# Patient Record
Sex: Female | Born: 1970 | Race: White | Hispanic: No | Marital: Married | Smoking: Former smoker
Health system: Southern US, Community
[De-identification: ages and names within clinical notes are randomized; demographics above are authoritative.]

## PROBLEM LIST (undated history)

## (undated) DIAGNOSIS — F329 Major depressive disorder, single episode, unspecified: Secondary | ICD-10-CM

## (undated) DIAGNOSIS — R1909 Other intra-abdominal and pelvic swelling, mass and lump: Secondary | ICD-10-CM

## (undated) DIAGNOSIS — M7710 Lateral epicondylitis, unspecified elbow: Secondary | ICD-10-CM

## (undated) DIAGNOSIS — R002 Palpitations: Secondary | ICD-10-CM

## (undated) DIAGNOSIS — M1990 Unspecified osteoarthritis, unspecified site: Secondary | ICD-10-CM

## (undated) DIAGNOSIS — F32A Depression, unspecified: Secondary | ICD-10-CM

## (undated) DIAGNOSIS — E785 Hyperlipidemia, unspecified: Secondary | ICD-10-CM

## (undated) DIAGNOSIS — I341 Nonrheumatic mitral (valve) prolapse: Secondary | ICD-10-CM

## (undated) DIAGNOSIS — J329 Chronic sinusitis, unspecified: Secondary | ICD-10-CM

## (undated) DIAGNOSIS — K648 Other hemorrhoids: Secondary | ICD-10-CM

## (undated) DIAGNOSIS — R079 Chest pain, unspecified: Secondary | ICD-10-CM

## (undated) DIAGNOSIS — F419 Anxiety disorder, unspecified: Secondary | ICD-10-CM

## (undated) DIAGNOSIS — E669 Obesity, unspecified: Secondary | ICD-10-CM

## (undated) DIAGNOSIS — I10 Essential (primary) hypertension: Secondary | ICD-10-CM

## (undated) DIAGNOSIS — M109 Gout, unspecified: Secondary | ICD-10-CM

## (undated) DIAGNOSIS — K589 Irritable bowel syndrome without diarrhea: Secondary | ICD-10-CM

## (undated) DIAGNOSIS — J189 Pneumonia, unspecified organism: Secondary | ICD-10-CM

## (undated) DIAGNOSIS — K760 Fatty (change of) liver, not elsewhere classified: Secondary | ICD-10-CM

## (undated) HISTORY — DX: Chronic sinusitis, unspecified: J32.9

## (undated) HISTORY — DX: Hyperlipidemia, unspecified: E78.5

## (undated) HISTORY — DX: Nonrheumatic mitral (valve) prolapse: I34.1

## (undated) HISTORY — DX: Lateral epicondylitis, unspecified elbow: M77.10

## (undated) HISTORY — DX: Depression, unspecified: F32.A

## (undated) HISTORY — DX: Other intra-abdominal and pelvic swelling, mass and lump: R19.09

## (undated) HISTORY — DX: Anxiety disorder, unspecified: F41.9

## (undated) HISTORY — DX: Other hemorrhoids: K64.8

## (undated) HISTORY — DX: Pneumonia, unspecified organism: J18.9

## (undated) HISTORY — PX: ENDOMETRIAL ABLATION: SHX621

## (undated) HISTORY — DX: Unspecified osteoarthritis, unspecified site: M19.90

## (undated) HISTORY — DX: Chest pain, unspecified: R07.9

## (undated) HISTORY — DX: Fatty (change of) liver, not elsewhere classified: K76.0

## (undated) HISTORY — DX: Gout, unspecified: M10.9

## (undated) HISTORY — DX: Palpitations: R00.2

## (undated) HISTORY — PX: TUBAL LIGATION: SHX77

## (undated) HISTORY — DX: Essential (primary) hypertension: I10

## (undated) HISTORY — DX: Irritable bowel syndrome, unspecified: K58.9

## (undated) HISTORY — DX: Obesity, unspecified: E66.9

## (undated) HISTORY — PX: SPINE SURGERY: SHX786

## (undated) HISTORY — DX: Major depressive disorder, single episode, unspecified: F32.9

---

## 1997-10-26 ENCOUNTER — Ambulatory Visit (HOSPITAL_COMMUNITY): Admission: RE | Admit: 1997-10-26 | Discharge: 1997-10-26 | Payer: Self-pay | Admitting: *Deleted

## 1999-04-15 ENCOUNTER — Other Ambulatory Visit: Admission: RE | Admit: 1999-04-15 | Discharge: 1999-04-15 | Payer: Self-pay | Admitting: Obstetrics and Gynecology

## 1999-06-26 ENCOUNTER — Inpatient Hospital Stay (HOSPITAL_COMMUNITY): Admission: AD | Admit: 1999-06-26 | Discharge: 1999-06-26 | Payer: Self-pay | Admitting: Obstetrics and Gynecology

## 1999-10-09 ENCOUNTER — Inpatient Hospital Stay (HOSPITAL_COMMUNITY): Admission: AD | Admit: 1999-10-09 | Discharge: 1999-10-09 | Payer: Self-pay | Admitting: Obstetrics and Gynecology

## 1999-10-11 ENCOUNTER — Inpatient Hospital Stay (HOSPITAL_COMMUNITY): Admission: AD | Admit: 1999-10-11 | Discharge: 1999-10-11 | Payer: Self-pay | Admitting: *Deleted

## 1999-11-07 ENCOUNTER — Inpatient Hospital Stay (HOSPITAL_COMMUNITY): Admission: AD | Admit: 1999-11-07 | Discharge: 1999-11-10 | Payer: Self-pay | Admitting: Obstetrics and Gynecology

## 1999-11-11 ENCOUNTER — Inpatient Hospital Stay (HOSPITAL_COMMUNITY): Admission: AD | Admit: 1999-11-11 | Discharge: 1999-11-11 | Payer: Self-pay | Admitting: Obstetrics and Gynecology

## 1999-11-17 ENCOUNTER — Inpatient Hospital Stay (HOSPITAL_COMMUNITY): Admission: AD | Admit: 1999-11-17 | Discharge: 1999-11-17 | Payer: Self-pay | Admitting: Physical Therapy

## 1999-12-20 ENCOUNTER — Other Ambulatory Visit: Admission: RE | Admit: 1999-12-20 | Discharge: 1999-12-20 | Payer: Self-pay | Admitting: Obstetrics and Gynecology

## 2001-01-01 ENCOUNTER — Other Ambulatory Visit: Admission: RE | Admit: 2001-01-01 | Discharge: 2001-01-01 | Payer: Self-pay | Admitting: Obstetrics and Gynecology

## 2002-01-22 ENCOUNTER — Other Ambulatory Visit: Admission: RE | Admit: 2002-01-22 | Discharge: 2002-01-22 | Payer: Self-pay | Admitting: Obstetrics and Gynecology

## 2003-04-01 ENCOUNTER — Other Ambulatory Visit: Admission: RE | Admit: 2003-04-01 | Discharge: 2003-04-01 | Payer: Self-pay | Admitting: Obstetrics and Gynecology

## 2004-05-03 ENCOUNTER — Other Ambulatory Visit: Admission: RE | Admit: 2004-05-03 | Discharge: 2004-05-03 | Payer: Self-pay | Admitting: Obstetrics and Gynecology

## 2005-01-29 ENCOUNTER — Encounter: Admission: RE | Admit: 2005-01-29 | Discharge: 2005-01-29 | Payer: Self-pay | Admitting: Family Medicine

## 2006-12-14 ENCOUNTER — Other Ambulatory Visit: Payer: Self-pay | Admitting: Obstetrics and Gynecology

## 2006-12-15 ENCOUNTER — Emergency Department (HOSPITAL_COMMUNITY): Admission: EM | Admit: 2006-12-15 | Discharge: 2006-12-15 | Payer: Self-pay | Admitting: Emergency Medicine

## 2006-12-15 ENCOUNTER — Ambulatory Visit: Payer: Self-pay | Admitting: Cardiovascular Disease

## 2008-09-28 ENCOUNTER — Encounter
Admission: RE | Admit: 2008-09-28 | Discharge: 2008-09-28 | Payer: Self-pay | Admitting: Physical Medicine and Rehabilitation

## 2009-01-16 HISTORY — PX: CERVICAL FUSION: SHX112

## 2009-06-04 ENCOUNTER — Ambulatory Visit (HOSPITAL_COMMUNITY): Admission: RE | Admit: 2009-06-04 | Discharge: 2009-06-04 | Payer: Self-pay | Admitting: Orthopedic Surgery

## 2009-06-16 HISTORY — PX: LUMBAR DISC SURGERY: SHX700

## 2009-08-10 ENCOUNTER — Encounter: Admission: RE | Admit: 2009-08-10 | Discharge: 2009-08-10 | Payer: Self-pay | Admitting: Neurosurgery

## 2009-09-03 ENCOUNTER — Encounter: Admission: RE | Admit: 2009-09-03 | Discharge: 2009-09-03 | Payer: Self-pay | Admitting: Neurosurgery

## 2009-09-13 ENCOUNTER — Encounter: Payer: Self-pay | Admitting: Internal Medicine

## 2010-02-15 NOTE — Letter (Signed)
Summary: Colonoscopy Letter  [facility]  [address] [place], [place] [address]   Phone: [phone]  Fax: [phone]      September 13, 2009 MRN: [id number]   Jasmine Nolan [address] [place], [place]  [address]   Dear [name],   According to your medical record, it is time for you to schedule a Colonoscopy. The American Cancer Society recommends this procedure as a method to detect early colon cancer. Patients with a family history of colon cancer, or a personal history of colon polyps or inflammatory bowel disease are at increased risk.  This letter has been generated based on the recommendations made at the time of your procedure. If you feel that in your particular situation this may no longer apply, please contact our office.  Please call our office at [phone] to schedule this appointment or to update your records at your earliest convenience.  Thank you for cooperating with [place] to provide you with the very best care possible.   Sincerely,  Wilhemina Bonito. Marina Goodell, M.D.  [facility] [phone]

## 2010-04-04 LAB — CBC
RBC: 4.75 MIL/uL (ref 3.87–5.11)
RDW: 15.9 % — ABNORMAL HIGH (ref 11.5–15.5)

## 2010-06-03 NOTE — H&P (Signed)
[facility] [place]  Patient:    Jasmine Nolan, Jasmine Nolan                          MRN: [id number] Adm. Date:  [id number] Attending:  Trevor Iha                         History and Physical  HISTORY OF PRESENT ILLNESS:   [name] is a 40 year old G1, P0, at 39+ weeks who presents for primary cesarean section.  The pregnancy has been complicated by severe hemorrhoids, currently on narcotics for pain relief and has tried multiple regimens for relief.  Because of severe hemorrhoids, unfavorable cervix, and the patient refuses a trial of labor because she does not want to worsen hemorrhoids, the patient presents for primary low segment transverse cesarean section.  Estimated date of confinement is November 10, 1999.  PAST MEDICAL HISTORY:         Mitral valve prolapse requiring prophylaxis.  PAST SURGICAL HISTORY:        Endoscopy.  Colonoscopy.  Laparoscopy.  MEDICATIONS:                  Vicodin as needed.  Tylenol as needed.  ALLERGIES:                    BENTYL, AMOXICILLIN, SULFA.  Amoxicillin and sulfa cause stomach cramps.  PHYSICAL EXAMINATION:  VITAL SIGNS:                  Blood pressure 117/68.  HEART:                        Regular rate and rhythm.  LUNGS:                        Clear to auscultation bilaterally.  ABDOMEN:                      Gravid, nontender.  PELVIC:                       Cervix is closed, thigh, and high.  Several large hemorrhoids are noted, markedly inflamed.  IMPRESSION:                   Intrauterine pregnancy at 39 weeks and severe hemorrhoids.  The patient desires primary low transverse section and refuses attempt at trial of labor because of the severity of the hemorrhoids.  The risks and benefits were discussed at length.  Informed consent was obtained.  PLAN:                         Primary low segment transverse cesarean section. DD:  11/07/99 TD:  11/07/99 Job: [id number]

## 2010-06-03 NOTE — Op Note (Signed)
[facility] [place]  Patient:    Jasmine Nolan, Jasmine Nolan                          MRN: [id number] Proc. Date: 11/07/99 Adm. Date:  [id number] Attending:  Trevor Iha                           Operative Report  PREOPERATIVE DIAGNOSIS:       Intrauterine pregnancy at 39 weeks, unfavorable                               cervix, and severe hemorrhoids.  Patient                               requesting primary cesarean section.  POSTOPERATIVE DIAGNOSIS:      Intrauterine pregnancy at 39 weeks, unfavorable                               cervix, and severe hemorrhoids.  Patient                               requesting primary cesarean section.  OPERATION:                    Primary low segment transverse cesarean section.  SURGEON:                      Trevor Iha, M.D.  ANESTHESIA:                   Spinal.  ESTIMATED BLOOD LOSS:         800 cc.  INDICATIONS:                  The patient is a 40 year old white female, gravida 1, para 0, at 39 weeks with severe hemorrhoids.  Currently on narcotics for pain relief, and tried multiple courses of hydrocortisone suppositories without success.  Desires primary low segment transverse cesarean section because of the severity of the hemorrhoids.  The risks and benefits were discussed and informed consent was obtained.  DESCRIPTION OF PROCEDURE:     After adequate analgesia, the patient was placed in the supine position with a left lateral tilt.  She was sterilely prepped and draped.  A Pfannenstiel skin incision was made two fingerbreadths above the pubic symphysis.  It was taken down sharply and the fascia was incised transversely, and extended superiorly and inferiorly up to the bellies of the rectus muscle which were separated sharply in the midline.  The peritoneum was entered sharply.  A bladder blade was placed and the underlying serosa was elevated, nicked, and incised transversely.  A bladder flap was created  and placed behind the bladder blade.  A ________ incision was made down to the infants vertex, and extended laterally with the operators fingertips.  The infants vertex was delivered with a vacuum extractor with one easy pull.  The nares and pharynx were suctioned.  A nuchal cord x 1 was reduced.  The infant was then delivered atraumatically.  The cord was clamped and the infant was handed to the pediatrician.  Cord blood was obtained.  The placenta extracted manually.  It was exteriorized and wiped clean with the dry lap, and ______ incision was closed in two layers with the first being a running locking layer, and the second being an imbricating layer, and 0 Monocryl.  The uterus was placed back into the peritoneal cavity, and after a copious amount of irrigation, and adequate hemostasis was assured, the peritoneum was closed with 0 Monocryl.  The rectus muscle was plicated in the midline.  Irrigation was again applied and after adequate hemostasis, the fascia was closed with 0 Panacryl in a running fashion.  Irrigation was again applied, and after adequate hemostasis, the skin was stapled, and Steri-Strips applied.                                The patient tolerated the procedure well and was stable on transfer to the recovery room.  The sponge and instrument count was normal x 3.  The patient received 1 g of cefotetan preoperatively for her mitral valve prolapse. DD:  11/07/99 TD:  11/07/99 Job: [id number]

## 2010-06-06 ENCOUNTER — Ambulatory Visit (INDEPENDENT_AMBULATORY_CARE_PROVIDER_SITE_OTHER): Payer: BC Managed Care – PPO | Admitting: Family Medicine

## 2010-06-06 ENCOUNTER — Encounter: Payer: Self-pay | Admitting: Family Medicine

## 2010-06-06 VITALS — BP 120/70 | HR 64 | Temp 98.4°F | Ht 64.0 in | Wt 176.1 lb

## 2010-06-06 DIAGNOSIS — M129 Arthropathy, unspecified: Secondary | ICD-10-CM

## 2010-06-06 DIAGNOSIS — M2550 Pain in unspecified joint: Secondary | ICD-10-CM | POA: Insufficient documentation

## 2010-06-06 DIAGNOSIS — F32A Depression, unspecified: Secondary | ICD-10-CM

## 2010-06-06 DIAGNOSIS — M1990 Unspecified osteoarthritis, unspecified site: Secondary | ICD-10-CM

## 2010-06-06 DIAGNOSIS — E669 Obesity, unspecified: Secondary | ICD-10-CM

## 2010-06-06 DIAGNOSIS — F329 Major depressive disorder, single episode, unspecified: Secondary | ICD-10-CM

## 2010-06-06 NOTE — Assessment & Plan Note (Signed)
New.  Likely due to rapid weight gain but given hand involvement and swelling/erythema, will rule out RA. Orders Placed This Encounter  Procedures  . C-reactive protein  . Sed Rate (ESR)  . Rheumatoid Factor

## 2010-06-06 NOTE — Assessment & Plan Note (Signed)
Stable. Continue current dose of Zoloft.

## 2010-06-06 NOTE — Assessment & Plan Note (Signed)
Deteriorated. Advised keeping food/calorie journal. Pt to bring to next OV.

## 2010-06-06 NOTE — Progress Notes (Signed)
40 yo here to establish care.  1. Arthritis-multiple joints.  Started a few months ago. Bilateral knees, elbows and 2nd PIP joints. Intermittent in nature.  Not worse during particular time of day. Has a desk job.  Only thing that has changed in past several months is that she has gained 20 pounds since her neck surgery (less active). Sometimes finger joints look swollen and red.  First cousin has RA, no other family h/o autoimmune or joint problems (other than OA). No rashes.  2.  Depression- has been on Zoloft since her dad died of colon cancer 10 years ago. Feels she is doing well, except during anniversaries and holidays. Good support system in her family.  Divorced two years ago but she feels she and her daughter are handling it well.  3.  Weight gain- wants to loose weight.  Knows she needs to be more active and start watching her calories. Denies any symptoms of hypo or hyper thyroidism.    The PMH, PSH, Social History, Family History, Medications, and allergies have been reviewed in [facility], and have been updated if relevant.  ROS: Patient reports no  vision/ hearing changes,anorexia, weight change, fever ,adenopathy, persistant / recurrent hoarseness, swallowing issues, chest pain, edema,persistant / recurrent cough, hemoptysis, dyspnea(rest, exertional, paroxysmal nocturnal), gastrointestinal  bleeding (melena, rectal bleeding), abdominal pain, excessive heart burn, GU symptoms(dysuria, hematuria, pyuria, voiding/incontinence  Issues) syncope, focal weakness, severe memory loss, concerning skin lesions, depression, anxiety, abnormal bruising/bleeding,  breast masses or abnormal vaginal bleeding.    Physical exam: BP 120/70  Pulse 64  Temp(Src) 98.4 F (36.9 C) (Oral)  Ht 5\' 4"  (1.626 m)  Wt 176 lb 1.9 oz (79.888 kg)  BMI 30.23 kg/m2  General:  overweight,well-nourished,in no acute distress; alert,appropriate and cooperative throughout examination Head:  normocephalic and  atraumatic.   Eyes:  vision grossly intact, pupils equal, pupils round, and pupils reactive to light.   Ears:  R ear normal and L ear normal.   Nose:  no external deformity.   Mouth:  good dentition.   Neck:  No deformities, masses, or tenderness noted.  Lungs:  Normal respiratory effort, chest expands symmetrically. Lungs are clear to auscultation, no crackles or wheezes. Heart:  Normal rate and regular rhythm. S1 and S2 normal without gallop, murmur, click, rub or other extra sounds. Abdomen:  Bowel sounds positive,abdomen soft and non-tender without masses, organomegaly or hernias noted. Msk:  No deformity or scoliosis noted of thoracic or lumbar spine.   Extremities:  No clubbing, cyanosis, edema, or deformity noted with normal full range of motion of all joints.   Neurologic:  alert & oriented X3 and gait normal.   Skin:  Intact without suspicious lesions or rashes Cervical Nodes:  No lymphadenopathy noted Axillary Nodes:  No palpable lymphadenopathy Psych:  Cognition and judgment appear intact. Alert and cooperative with normal attention span and concentration. No apparent delusions, illusions, hallucinations

## 2010-06-07 LAB — RHEUMATOID FACTOR: Rhuematoid fact SerPl-aCnc: <10 IU/mL (ref <=14)

## 2010-09-29 ENCOUNTER — Ambulatory Visit (INDEPENDENT_AMBULATORY_CARE_PROVIDER_SITE_OTHER): Payer: BC Managed Care – PPO | Admitting: Family Medicine

## 2010-09-29 ENCOUNTER — Encounter: Payer: Self-pay | Admitting: Family Medicine

## 2010-09-29 VITALS — BP 118/62 | HR 86 | Temp 98.5°F | Wt 176.2 lb

## 2010-09-29 DIAGNOSIS — F329 Major depressive disorder, single episode, unspecified: Secondary | ICD-10-CM

## 2010-09-29 DIAGNOSIS — F32A Depression, unspecified: Secondary | ICD-10-CM

## 2010-09-29 MED ORDER — BUSPIRONE HCL 15 MG PO TABS: 15.0000 mg | ORAL_TABLET | Freq: Two times a day (BID) | ORAL | Status: DC

## 2010-09-29 NOTE — Progress Notes (Signed)
  Subjective:    Patient ID: Jasmine Nolan, female    DOB: [birth year], 40 y.o.   MRN: [id number]  HPI  40 yo here for follow up anxiety/depression.  Has been on Zoloft 100 mg twice daily for over 10 years since her dad died. Dad died while she was pregnant with her daughter and then she had severe post partum depression. Has been doing well on this dose until recently.  Boyfriend was in a very serious accident and she has been caring for him. Work a little more stressful as well. Finds herself feeling very anxious all the time. No panic attacks, no Si or HI. Sometimes has a hard time staying asleep. No trouble falling asleep. Does not "feel depressed."  Patient Active Problem List  Diagnoses  . Joint pain  . Arthritis  . Depression  . Obesity   Past Medical History  Diagnosis Date  . Depression   . MVP (mitral valve prolapse)   . Arthritis    Past Surgical History  Procedure Date  . Cesarean section 2001  . Lumbar disc surgery June 2011  . Tubal ligation   . Endometrial ablation    History  Substance Use Topics  . Smoking status: Current Some Day Smoker  . Smokeless tobacco: Not on file  . Alcohol Use: Not on file   Family History  Problem Relation Age of Onset  . Cancer Father 64    died of colon CA  . Depression Mother   . Arthritis Mother    Allergies  Allergen Reactions  . Bactrim Nausea And Vomiting  . Bentyl Rash  . Tetanus Toxoids Other (See Comments)    fever   Current Outpatient Prescriptions on File Prior to Visit  Medication Sig Dispense Refill  . sertraline (ZOLOFT) 100 MG tablet Take 100 mg by mouth 2 (two) times daily.         The PMH, PSH, Social History, Family History, Medications, and allergies have been reviewed in [facility], and have been updated if relevant.   Review of Systems See HPI    Objective:   Physical Exam BP 118/62  Pulse 86  Temp(Src) 98.5 F (36.9 C) (Oral)  Wt 176 lb 4 oz (79.946 kg) Gen:  Alert, pleasant, good eye  contact Psych:  A little tearful but not anxious or depressed appearing.     Assessment & Plan:   1. Depression  busPIRone (BUSPAR) 15 MG tablet  Deteriorated. >25 min spent with face to face with patient, >50% counseling and/or coordinating care Will add Buspar 15 mg twice daily to Zoloft. See pt instructions for details.

## 2010-09-29 NOTE — Patient Instructions (Signed)
Good to see you, Lawson Fiscal. For the first few days that you are taking buspar, cut the tablet in half so you are taking 7.5 mg twice daily.  Hang in there. Call in me a few weeks to let me know how you're doing.    What is this medicine? BUSPIRONE (byoo SPYE rone) is used to treat anxiety disorders. This medicine may be used for other purposes; ask your health care provider or pharmacist if you have questions.   What should I tell my health care provider before I take this medicine? They need to know if you have any of these conditions: -kidney or liver disease -an unusual or allergic reaction to buspirone, other medicines, foods, dyes, or preservatives -pregnant or trying to get pregnant -breast-feeding   How should I use this medicine? Take this medicine by mouth with a glass of water. Follow the directions on the prescription label. You may take this medicine with or without food. To ensure that this medicine always works the same way for you, you should take it either always with or always without food. Take your doses at regular intervals. Do not take your medicine more often than directed. Do not stop taking except on the advice of your doctor or health care professional.   Talk to your pediatrician regarding the use of this medicine in children. Special care may be needed.   Overdosage: If you think you have taken too much of this medicine contact a poison control center or emergency room at once. NOTE: This medicine is only for you. Do not share this medicine with others.   What if I miss a dose? If you miss a dose, take it as soon as you can. If it is almost time for your next dose, take only that dose. Do not take double or extra doses.   What may interact with this medicine? Do not take this medicine with any of the following medications: -linezolid -MAOIs like Carbex, Eldepryl, Marplan, Nardil, and Parnate -methylene blue -procarbazine This medicine may also interact with  the following medications: -diazepam -digoxin -diltiazem -erythromycin -grapefruit juice -haloperidol -medicines for mental depression or mood problems -medicines for seizures like carbamazepine, phenobarbital and phenytoin -nefazodone -other medications for anxiety -rifampin -ritonavir -some antifungal medicines like itraconazole, ketoconazole, and voriconazole -verapamil -warfarin This list may not describe all possible interactions. Give your health care provider a list of all the medicines, herbs, non-prescription drugs, or dietary supplements you use. Also tell them if you smoke, drink alcohol, or use illegal drugs. Some items may interact with your medicine.   What should I watch for while using this medicine? Visit your doctor or health care professional for regular checks on your progress. It may take 1 to 2 weeks before your anxiety gets better.   You may get drowsy or dizzy. Do not drive, use machinery, or do anything that needs mental alertness until you know how this drug affects you. Do not stand or sit up quickly, especially if you are an older patient. This reduces the risk of dizzy or fainting spells. Alcohol can make you more drowsy and dizzy. Avoid alcoholic drinks.   What side effects may I notice from receiving this medicine? Side effects that you should report to your doctor or health care professional as soon as possible: -blurred vision or other vision changes -chest pain -confusion -difficulty breathing -feelings of hostility or anger -muscle aches and pains -numbness or tingling in hands or feet -ringing in the ears -skin  rash and itching -vomiting -weakness   Side effects that usually do not require medical attention (report to your doctor or health care professional if they continue or are bothersome): -disturbed dreams, nightmares -headache -nausea -restlessness or nervousness -sore throat and nasal congestion -stomach upset   This list may not  describe all possible side effects. Call your doctor for medical advice about side effects. You may report side effects to FDA at 1-800-FDA-1088.   Where should I keep my medicine? Keep out of the reach of children.   Store at room temperature below 30 degrees C (86 degrees F). Protect from light. Keep container tightly closed. Throw away any unused medicine after the expiration date.   NOTE:This sheet is a summary. It may not cover all possible information. If you have questions about this medicine, talk to your doctor, pharmacist, or health care provider.      2011, Elsevier/Gold Standard.

## 2010-10-13 ENCOUNTER — Encounter: Payer: Self-pay | Admitting: Family Medicine

## 2010-10-13 ENCOUNTER — Ambulatory Visit (INDEPENDENT_AMBULATORY_CARE_PROVIDER_SITE_OTHER): Payer: BC Managed Care – PPO | Admitting: Family Medicine

## 2010-10-13 VITALS — BP 102/74 | HR 67 | Temp 98.1°F | Wt 176.5 lb

## 2010-10-13 DIAGNOSIS — Z202 Contact with and (suspected) exposure to infections with a predominantly sexual mode of transmission: Secondary | ICD-10-CM

## 2010-10-13 DIAGNOSIS — J329 Chronic sinusitis, unspecified: Secondary | ICD-10-CM

## 2010-10-13 MED ORDER — AMOXICILLIN-POT CLAVULANATE 875-125 MG PO TABS: 1.0000 | ORAL_TABLET | Freq: Two times a day (BID) | ORAL | Status: AC

## 2010-10-13 NOTE — Progress Notes (Signed)
SUBJECTIVE:  Jasmine Nolan is a 40 y.o. female who complains of congestion, bilateral sinus pain and fever for 12 days. She denies a history of anorexia, chest pain, chills and dizziness and denies a history of asthma. Patient does smoke cigarettes.  Also found out that her boyfriend has HSV. Would like to be tested.  She has not yet developed any lesions.    Patient Active Problem List  Diagnoses  . Joint pain  . Arthritis  . Depression  . Obesity  . Exposure to STD  . Sinusitis   Past Medical History  Diagnosis Date  . Depression   . MVP (mitral valve prolapse)   . Arthritis    Past Surgical History  Procedure Date  . Cesarean section 2001  . Lumbar disc surgery June 2011  . Tubal ligation   . Endometrial ablation    History  Substance Use Topics  . Smoking status: Current Some Day Smoker  . Smokeless tobacco: Not on file  . Alcohol Use: Not on file   Family History  Problem Relation Age of Onset  . Cancer Father 49    died of colon CA  . Depression Mother   . Arthritis Mother    Allergies  Allergen Reactions  . Bactrim Nausea And Vomiting  . Bentyl Rash  . Tetanus Toxoids Other (See Comments)    fever   Current Outpatient Prescriptions on File Prior to Visit  Medication Sig Dispense Refill  . busPIRone (BUSPAR) 15 MG tablet Take 1 tablet (15 mg total) by mouth 2 (two) times daily.  60 tablet  2  . sertraline (ZOLOFT) 100 MG tablet Take 100 mg by mouth 2 (two) times daily.            OBJECTIVE: BP 102/74  Pulse 67  Temp(Src) 98.1 F (36.7 C) (Oral)  Wt 176 lb 8 oz (80.06 kg)  She appears well, vital signs are as noted. Ears normal.  Throat and pharynx normal.  Neck supple. No adenopathy in the neck. Nose is congested. Sinuses diffusely tender. The chest is clear, without wheezes or rales.  ASSESSMENT:  sinusitis  PLAN: Symptomatic therapy suggested: push fluids, rest and return office visit prn if symptoms persist or worsen. Given duration and  progression of symptoms, will treat for bacterial sinusitis.  Call or return to clinic prn if these symptoms worsen or fail to improve as anticipated.

## 2010-10-13 NOTE — Patient Instructions (Signed)
Good to see you. Take antibiotic as directed.  Drink lots of fluids.  Treat sympotmatically with Mucinex, nasal saline irrigation, and Tylenol/Ibuprofen. Call if not improving as expected in 5-7 days.

## 2010-10-14 LAB — HSV 2 ANTIBODY, IGG: HSV 2 Glycoprotein G Ab, IgG: 0.14 IV

## 2010-10-14 LAB — RPR

## 2010-10-25 LAB — URINALYSIS, ROUTINE W REFLEX MICROSCOPIC
Bilirubin Urine: NEGATIVE
Glucose, UA: NEGATIVE
Nitrite: NEGATIVE
Protein, ur: NEGATIVE
pH: 6

## 2010-10-25 LAB — CBC
HCT: 43
Hemoglobin: 14.5
MCV: 88.6
Platelets: 220
Platelets: 211
RDW: 13.7
RDW: 13.5
WBC: 10.6 — ABNORMAL HIGH

## 2010-10-25 LAB — COMPREHENSIVE METABOLIC PANEL
AST: 24
Albumin: 3.5
CO2: 22
Creatinine, Ser: 0.79
GFR calc non Af Amer: >60
Potassium: 3.7
Sodium: 136
Total Bilirubin: 0.9

## 2010-10-25 LAB — DIFFERENTIAL
Basophils Absolute: 0
Lymphocytes Relative: 22
Lymphs Abs: 2.5
Monocytes Absolute: 0.6
Neutrophils Relative %: 71

## 2010-10-25 LAB — URINE MICROSCOPIC-ADD ON

## 2010-10-27 ENCOUNTER — Ambulatory Visit (INDEPENDENT_AMBULATORY_CARE_PROVIDER_SITE_OTHER): Payer: BC Managed Care – PPO | Admitting: Family Medicine

## 2010-10-27 ENCOUNTER — Encounter: Payer: Self-pay | Admitting: Family Medicine

## 2010-10-27 VITALS — BP 120/70 | HR 64 | Temp 98.5°F | Ht 64.0 in | Wt 174.8 lb

## 2010-10-27 DIAGNOSIS — M7710 Lateral epicondylitis, unspecified elbow: Secondary | ICD-10-CM | POA: Insufficient documentation

## 2010-10-27 MED ORDER — MELOXICAM 15 MG PO TABS: 15.0000 mg | ORAL_TABLET | Freq: Every day | ORAL | Status: DC

## 2010-10-27 NOTE — Progress Notes (Signed)
Subjective:    Jasmine Nolan is a 40 y.o. female who presents with left elbow pain. Onset of the symptoms was several weeks ago. Inciting event: increased use and carrying heavy bags. Current symptoms include: point tenderness around tendon insertion. Pain is aggravated by: grasping, supination/pronation as when opening doors. Symptoms have been intermittent. Patient has had no prior elbow problems but she has had cervical neck surgery.  No tingling into her fingers or UE weakness.   Patient Active Problem List  Diagnoses  . Joint pain  . Arthritis  . Depression  . Obesity  . Exposure to STD  . Sinusitis  . Lateral epicondylitis  of elbow   Past Medical History  Diagnosis Date  . Depression   . MVP (mitral valve prolapse)   . Arthritis    Past Surgical History  Procedure Date  . Cesarean section 2001  . Lumbar disc surgery June 2011  . Tubal ligation   . Endometrial ablation    History  Substance Use Topics  . Smoking status: Current Some Day Smoker  . Smokeless tobacco: Not on file  . Alcohol Use: Not on file   Family History  Problem Relation Age of Onset  . Cancer Father 52    died of colon CA  . Depression Mother   . Arthritis Mother    Allergies  Allergen Reactions  . Bactrim Nausea And Vomiting  . Bentyl Rash  . Tetanus Toxoids Other (See Comments)    fever   Current Outpatient Prescriptions on File Prior to Visit  Medication Sig Dispense Refill  . busPIRone (BUSPAR) 15 MG tablet Take 1 tablet (15 mg total) by mouth 2 (two) times daily.  60 tablet  2  . sertraline (ZOLOFT) 100 MG tablet Take 100 mg by mouth 2 (two) times daily.         The PMH, PSH, Social History, Family History, Medications, and allergies have been reviewed in [facility], and have been updated if relevant.   Review of Systems Pertinent items are noted in HPI.   Objective:    BP 120/70  Pulse 64  Temp(Src) 98.5 F (36.9 C) (Oral)  Ht 5\' 4"  (1.626 m)  Wt 174 lb 12 oz (79.266 kg)  BMI  30.00 kg/m2 Right elbow: Normal, FROM without tenderness  Left elbow:  full active ROM and tenderness over lateral epicondyle     Assessment:    left lateral epicondylitis    Plan:    Rest, ice, compression, and elevation (RICE) therapy. Meloxicam and stretches and instructions given from Sports Medicine Advisor.

## 2010-10-27 NOTE — Patient Instructions (Signed)
Good to see you. Please take Meloxicam daily for next 1 or 2. Follow stretches on handout.

## 2010-11-02 ENCOUNTER — Encounter: Payer: Self-pay | Admitting: Family Medicine

## 2010-11-02 ENCOUNTER — Ambulatory Visit (INDEPENDENT_AMBULATORY_CARE_PROVIDER_SITE_OTHER): Payer: BC Managed Care – PPO | Admitting: Family Medicine

## 2010-11-02 VITALS — BP 110/60 | HR 57 | Temp 98.5°F | Wt 175.0 lb

## 2010-11-02 DIAGNOSIS — W57XXXA Bitten or stung by nonvenomous insect and other nonvenomous arthropods, initial encounter: Secondary | ICD-10-CM | POA: Insufficient documentation

## 2010-11-02 DIAGNOSIS — T148XXA Other injury of unspecified body region, initial encounter: Secondary | ICD-10-CM

## 2010-11-02 DIAGNOSIS — IMO0001 Reserved for inherently not codable concepts without codable children: Secondary | ICD-10-CM

## 2010-11-02 MED ORDER — DOXYCYCLINE HYCLATE 100 MG PO CAPS: 100.0000 mg | ORAL_CAPSULE | Freq: Two times a day (BID) | ORAL | Status: AC

## 2010-11-02 NOTE — Patient Instructions (Signed)
Continue to keep area clean and covered. Start taking the Doxycyline and you may want to try taking benadryl 25 mg nightly for next few days. Keep me updated with your symptoms.

## 2010-11-02 NOTE — Progress Notes (Signed)
  Subjective:    Patient ID: Jasmine Nolan, female    DOB: 1970-10-26, 40 y.o.   MRN: [id number]  HPI  40 yo here for ?spider bite.  Did not actually see anything bite her but woke up on Sunday morning with what looked like a mosquito bite on her right forearm. Over next several days, became larger and blister busted. Now raised erythematous, itchy and painful area around it.  Has not been taking anything for it.  No fever, myalgias, chills.   Review of Systems See HPI  Patient Active Problem List  Diagnoses  . Joint pain  . Arthritis  . Depression  . Obesity  . Exposure to STD  . Sinusitis  . Lateral epicondylitis  of elbow   Past Medical History  Diagnosis Date  . Depression   . MVP (mitral valve prolapse)   . Arthritis    Past Surgical History  Procedure Date  . Cesarean section 2001  . Lumbar disc surgery June 2011  . Tubal ligation   . Endometrial ablation    History  Substance Use Topics  . Smoking status: Current Some Day Smoker  . Smokeless tobacco: Not on file  . Alcohol Use: Not on file   Family History  Problem Relation Age of Onset  . Cancer Father 35    died of colon CA  . Depression Mother   . Arthritis Mother    Allergies  Allergen Reactions  . Bactrim Nausea And Vomiting  . Bentyl Rash  . Tetanus Toxoids Other (See Comments)    fever   Current Outpatient Prescriptions on File Prior to Visit  Medication Sig Dispense Refill  . busPIRone (BUSPAR) 15 MG tablet Take 1 tablet (15 mg total) by mouth 2 (two) times daily.  60 tablet  2  . meloxicam (MOBIC) 15 MG tablet Take 1 tablet (15 mg total) by mouth daily.  30 tablet  2  . sertraline (ZOLOFT) 100 MG tablet Take 100 mg by mouth 2 (two) times daily.         The PMH, PSH, Social History, Family History, Medications, and allergies have been reviewed in [facility], and have been updated if relevant.     Objective:   Physical Exam BP 110/60  Pulse 57  Temp(Src) 98.5 F (36.9 C) (Oral)  Wt 175  lb (79.379 kg)  Gen:  Alert, pleasant, NAD Skin:  Two erythematous, small papular/pustular areas on right forearm. No surrounding erythema.     Assessment & Plan:   1. Bug bite with infection    New. No signs of necrosis. Will cover with doxycycline. See pt instructions for details.

## 2010-12-23 ENCOUNTER — Telehealth: Payer: Self-pay | Admitting: Internal Medicine

## 2010-12-23 NOTE — Telephone Encounter (Signed)
Made an appointment for Monday the 10th because of knot in groin area.

## 2010-12-26 ENCOUNTER — Ambulatory Visit (INDEPENDENT_AMBULATORY_CARE_PROVIDER_SITE_OTHER): Payer: BC Managed Care – PPO | Admitting: Family Medicine

## 2010-12-26 ENCOUNTER — Telehealth: Payer: Self-pay | Admitting: *Deleted

## 2010-12-26 ENCOUNTER — Encounter: Payer: Self-pay | Admitting: Family Medicine

## 2010-12-26 VITALS — BP 102/62 | HR 64 | Temp 98.4°F | Wt 173.5 lb

## 2010-12-26 DIAGNOSIS — R1909 Other intra-abdominal and pelvic swelling, mass and lump: Secondary | ICD-10-CM

## 2010-12-26 DIAGNOSIS — R1900 Intra-abdominal and pelvic swelling, mass and lump, unspecified site: Secondary | ICD-10-CM

## 2010-12-26 MED ORDER — OXYCODONE-ACETAMINOPHEN 5-325 MG PO TABS: 1.0000 | ORAL_TABLET | Freq: Four times a day (QID) | ORAL | Status: DC | PRN

## 2010-12-26 MED ORDER — DOXYCYCLINE HYCLATE 100 MG PO CAPS: 100.0000 mg | ORAL_CAPSULE | Freq: Two times a day (BID) | ORAL | Status: AC

## 2010-12-26 NOTE — Progress Notes (Signed)
  Subjective:    Patient ID: Jasmine Nolan, female    DOB: [birth year], 40 y.o.   MRN: [id number]  HPI  40 yo here with acute onset of knot in her left groin. Growing in size, very painful to touch. No fevers, chills, nausea or vomiting.  Taking alleve which is helping with the pain. Never had anything like this before.  Sulfa allergic.  Patient Active Problem List  Diagnoses  . Joint pain  . Arthritis  . Depression  . Obesity  . Exposure to STD  . Sinusitis  . Lateral epicondylitis  of elbow  . Bug bite with infection   Past Medical History  Diagnosis Date  . Depression   . MVP (mitral valve prolapse)   . Arthritis    Past Surgical History  Procedure Date  . Cesarean section 2001  . Lumbar disc surgery June 2011  . Tubal ligation   . Endometrial ablation    History  Substance Use Topics  . Smoking status: Current Some Day Smoker  . Smokeless tobacco: Not on file  . Alcohol Use: Not on file   Family History  Problem Relation Age of Onset  . Cancer Father 21    died of colon CA  . Depression Mother   . Arthritis Mother    Allergies  Allergen Reactions  . Bactrim Nausea And Vomiting  . Bentyl Rash  . Tetanus Toxoids Other (See Comments)    fever   Current Outpatient Prescriptions on File Prior to Visit  Medication Sig Dispense Refill  . meloxicam (MOBIC) 15 MG tablet Take 1 tablet (15 mg total) by mouth daily.  30 tablet  2  . sertraline (ZOLOFT) 100 MG tablet Take 100 mg by mouth 2 (two) times daily.         The PMH, PSH, Social History, Family History, Medications, and allergies have been reviewed in [facility], and have been updated if relevant.   Review of Systems    See HPI Objective:   Physical Exam BP 102/62  Pulse 64  Temp(Src) 98.4 F (36.9 C) (Oral)  Wt 173 lb 8 oz (78.699 kg)  General:  Well-developed,well-nourished,in no acute distress; alert,appropriate and cooperative throughout examination Head:  normocephalic and atraumatic.   Eyes:   vision grossly intact, pupils equal, pupils round, and pupils reactive to light.   Ears:  R ear normal and L ear normal.   Nose:  no external deformity.   Msk:  No deformity or scoliosis noted of thoracic or lumbar spine.   Extremities:  No clubbing, cyanosis, edema, or deformity noted with normal full range of motion of all joints.   Neurologic:  alert & oriented X3 and gait normal.   Skin:  2 cm firm, freely moveable, very tender mass in left inguinal area, mild erythema, no warmch Psych:  Cognition and judgment appear intact. Alert and cooperative with normal attention span and concentration. No apparent delusions, illusions, hallucinations     Assessment & Plan:   1. Inguinal mass    New- deep abscess- non fluctuant vs lymphadenitis. No other enlarged lymph nodes. Discussed diff dx of lymphadenitis in adults. 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 Will placed on doxycyline and use warm compresses to see if it is an abscess. If symptoms worsen, will get imaging and blood work. The patient indicates understanding of these issues and agrees with the plan.

## 2010-12-26 NOTE — Telephone Encounter (Signed)
Is there another pharmacy that has it?  I prefer not to switch to minocycline.  If not, let's go with Augmentin 875- 1 tab po bid x 10 days, no refills.

## 2010-12-26 NOTE — Telephone Encounter (Signed)
Received a call from the pharmacist at [facility] stated that Doxycycline is on back order from the manufacture and they don't know when it will be available.  They do have Minocycline available, please advise.

## 2010-12-26 NOTE — Patient Instructions (Signed)
Good to see you. This is either a deep abscess or a swollen lymph node. Apply warm compresses as often as possible over the next several days. Take your doxycycline as directed. If no improvement of if symptoms worsen, we need to know immediately.

## 2010-12-27 NOTE — Telephone Encounter (Signed)
Rx for Augmentin called to [facility], patient notified via message left on cell phone voicemail.

## 2011-01-26 ENCOUNTER — Ambulatory Visit (INDEPENDENT_AMBULATORY_CARE_PROVIDER_SITE_OTHER): Payer: BC Managed Care – PPO | Admitting: Family Medicine

## 2011-01-26 ENCOUNTER — Encounter: Payer: Self-pay | Admitting: Family Medicine

## 2011-01-26 ENCOUNTER — Telehealth: Payer: Self-pay | Admitting: Internal Medicine

## 2011-01-26 VITALS — BP 132/80 | HR 61 | Temp 98.0°F | Wt 178.5 lb

## 2011-01-26 DIAGNOSIS — I341 Nonrheumatic mitral (valve) prolapse: Secondary | ICD-10-CM | POA: Insufficient documentation

## 2011-01-26 DIAGNOSIS — R002 Palpitations: Secondary | ICD-10-CM

## 2011-01-26 DIAGNOSIS — I059 Rheumatic mitral valve disease, unspecified: Secondary | ICD-10-CM

## 2011-01-26 MED ORDER — ALPRAZOLAM 0.25 MG PO TABS: 0.2500 mg | ORAL_TABLET | Freq: Three times a day (TID) | ORAL | Status: DC | PRN

## 2011-01-26 NOTE — Telephone Encounter (Signed)
Please have her come in now and double book me.

## 2011-01-26 NOTE — Progress Notes (Signed)
41 yo here for palpitations.  Has h/o MVP and recurrent palpitations. Has worn several holter monitors, unremarkable.  Has been under increased stressors at work lately. Yesterday, was at work, felt heart racing.  Had her BP checked which was normal but the nurse who checked it stated her heart was skipping beats.  No CP or SOB. No nausea or diaphoresis.  Patient Active Problem List  Diagnoses  . Joint pain  . Arthritis  . Depression  . Obesity  . Exposure to STD  . Sinusitis  . Lateral epicondylitis  of elbow  . Bug bite with infection  . Inguinal mass  . Heart palpitations  . Mitral valve prolapse   Past Medical History  Diagnosis Date  . Depression   . MVP (mitral valve prolapse)   . Arthritis    Past Surgical History  Procedure Date  . Cesarean section 2001  . Lumbar disc surgery June 2011  . Tubal ligation   . Endometrial ablation    History  Substance Use Topics  . Smoking status: Current Some Day Smoker  . Smokeless tobacco: Not on file  . Alcohol Use: Not on file   Family History  Problem Relation Age of Onset  . Cancer Father 75    died of colon CA  . Depression Mother   . Arthritis Mother    Allergies  Allergen Reactions  . Bactrim Nausea And Vomiting  . Bentyl Rash  . Tetanus Toxoids Other (See Comments)    fever   Current Outpatient Prescriptions on File Prior to Visit  Medication Sig Dispense Refill  . meloxicam (MOBIC) 15 MG tablet Take 1 tablet (15 mg total) by mouth daily.  30 tablet  2  . sertraline (ZOLOFT) 100 MG tablet Take 100 mg by mouth 2 (two) times daily.         The PMH, PSH, Social History, Family History, Medications, and allergies have been reviewed in [facility], and have been updated if relevant.   The PMH, PSH, Social History, Family History, Medications, and allergies have been reviewed in [facility], and have been updated if relevant.  ROS: Patient reports no  vision/ hearing changes,anorexia, weight change, fever ,adenopathy,  persistant / recurrent hoarseness, swallowing issues, chest pain, edema,persistant / recurrent cough, hemoptysis, dyspnea(rest, exertional, paroxysmal nocturnal), gastrointestinal  bleeding (melena, rectal bleeding), abdominal pain, excessive heart burn, GU symptoms(dysuria, hematuria, pyuria, voiding/incontinence  Issues) syncope, focal weakness, severe memory loss, concerning skin lesions, depression, anxiety, abnormal bruising/bleeding,  breast masses or abnormal vaginal bleeding.    Physical exam: BP 132/80  Pulse 61  Temp(Src) 98 F (36.7 C) (Oral)  Wt 178 lb 8 oz (80.967 kg)  General:  overweight,well-nourished,in no acute distress; alert,appropriate and cooperative throughout examination Head:  normocephalic and atraumatic.   Eyes:  vision grossly intact, pupils equal, pupils round, and pupils reactive to light.   Ears:  R ear normal and L ear normal.   Nose:  no external deformity.   Mouth:  good dentition.   Neck:  No deformities, masses, or tenderness noted.  Lungs:  Normal respiratory effort, chest expands symmetrically. Lungs are clear to auscultation, no crackles or wheezes. Heart:  Normal rate and regular rhythm. S1 and S2 normal without gallop, murmur, click, rub or other extra sounds. Abdomen:  Bowel sounds positive,abdomen soft and non-tender without masses, organomegaly or hernias noted. Msk:  No deformity or scoliosis noted of thoracic or lumbar spine.   Extremities:  No clubbing, cyanosis, edema, or deformity noted with normal  full range of motion of all joints.   Neurologic:  alert & oriented X3 and gait normal.   Skin:  Intact without suspicious lesions or rashes Psych:  Cognition and judgment appear intact. Alert and cooperative with normal attention span and concentration. No apparent delusions, illusions, hallucinations  Assessment and Plan: 1. Heart palpitations   Deteriorated. EKG NSR, unchanged from prior. Likely due to anxiety. BP normal today. Will give rx  for as needed xanax.  If symptoms persist, refer to cards for repeat Holter and further work up(pt declined at this time).

## 2011-01-26 NOTE — Patient Instructions (Signed)
Please let me know if your symptoms return or go straight to ER if it is after hours of you are having chest pain.

## 2011-01-26 NOTE — Telephone Encounter (Signed)
Patient called and stated she is having problems with her BP and pulse.  She has been lightheaded for the past 3 days and her heart is skipping a beat and they told her at work, she works at [employer] that that's probably why she is lightheaded.  Wanted to know if she could be seen today.

## 2011-01-26 NOTE — Telephone Encounter (Signed)
Patient advised to come in now

## 2011-01-31 ENCOUNTER — Encounter: Payer: Self-pay | Admitting: Internal Medicine

## 2011-02-16 ENCOUNTER — Ambulatory Visit (AMBULATORY_SURGERY_CENTER): Payer: BC Managed Care – PPO

## 2011-02-16 VITALS — Ht 64.0 in | Wt 175.0 lb

## 2011-02-16 DIAGNOSIS — Z1211 Encounter for screening for malignant neoplasm of colon: Secondary | ICD-10-CM

## 2011-02-16 MED ORDER — PEG-KCL-NACL-NASULF-NA ASC-C 100 G PO SOLR: 1.0000 | Freq: Once | ORAL | Status: DC

## 2011-02-24 ENCOUNTER — Encounter: Payer: BC Managed Care – PPO | Admitting: Internal Medicine

## 2011-03-08 ENCOUNTER — Ambulatory Visit (AMBULATORY_SURGERY_CENTER): Payer: BC Managed Care – PPO | Admitting: Internal Medicine

## 2011-03-08 ENCOUNTER — Encounter: Payer: Self-pay | Admitting: Internal Medicine

## 2011-03-08 VITALS — BP 117/76 | HR 71 | Temp 97.6°F | Resp 20 | Ht 64.0 in | Wt 175.0 lb

## 2011-03-08 DIAGNOSIS — Z800 Family history of malignant neoplasm of digestive organs: Secondary | ICD-10-CM

## 2011-03-08 DIAGNOSIS — Z1211 Encounter for screening for malignant neoplasm of colon: Secondary | ICD-10-CM

## 2011-03-08 MED ORDER — SODIUM CHLORIDE 0.9 % IV SOLN: 500.0000 mL | INTRAVENOUS | Status: DC

## 2011-03-08 NOTE — Patient Instructions (Signed)
YOU HAD AN ENDOSCOPIC PROCEDURE TODAY AT THE [facility]: Refer to the procedure report that was given to you for any specific questions about what was found during the examination.  If the procedure report does not answer your questions, please call your gastroenterologist to clarify.  If you requested that your care partner not be given the details of your procedure findings, then the procedure report has been included in a sealed envelope for you to review at your convenience later.  YOU SHOULD EXPECT: Some feelings of bloating in the abdomen. Passage of more gas than usual.  Walking can help get rid of the air that was put into your GI tract during the procedure and reduce the bloating. If you had a lower endoscopy (such as a colonoscopy or flexible sigmoidoscopy) you may notice spotting of blood in your stool or on the toilet paper. If you underwent a bowel prep for your procedure, then you may not have a normal bowel movement for a few days.  DIET: Your first meal following the procedure should be a light meal and then it is ok to progress to your normal diet.  A half-sandwich or bowl of soup is an example of a good first meal.  Heavy or fried foods are harder to digest and may make you feel nauseous or bloated.  Likewise meals heavy in dairy and vegetables can cause extra gas to form and this can also increase the bloating.  Drink plenty of fluids but you should avoid alcoholic beverages for 24 hours.  ACTIVITY: Your care partner should take you home directly after the procedure.  You should plan to take it easy, moving slowly for the rest of the day.  You can resume normal activity the day after the procedure however you should NOT DRIVE or use heavy machinery for 24 hours (because of the sedation medicines used during the test).    SYMPTOMS TO REPORT IMMEDIATELY: A gastroenterologist can be reached at any hour.  During normal business hours, 8:30 AM to 5:00 PM Monday through Friday,  call [phone].  After hours and on weekends, please call the GI answering service at [phone] who will take a message and have the physician on call contact you.   Following lower endoscopy (colonoscopy or flexible sigmoidoscopy):  Excessive amounts of blood in the stool  Significant tenderness or worsening of abdominal pains  Swelling of the abdomen that is new, acute  Fever of 100F or higher  Following upper endoscopy (EGD)  Vomiting of blood or coffee ground material  New chest pain or pain under the shoulder blades  Painful or persistently difficult swallowing  New shortness of breath  Fever of 100F or higher  Black, tarry-looking stools  FOLLOW UP: If any biopsies were taken you will be contacted by phone or by letter within the next 1-3 weeks.  Call your gastroenterologist if you have not heard about the biopsies in 3 weeks.  Our staff will call the home number listed on your records the next business day following your procedure to check on you and address any questions or concerns that you may have at that time regarding the information given to you following your procedure. This is a courtesy call and so if there is no answer at the home number and we have not heard from you through the emergency physician on call, we will assume that you have returned to your regular daily activities without incident.  SIGNATURES/CONFIDENTIALITY: You and/or your care   partner have signed paperwork which will be entered into your electronic medical record.  These signatures attest to the fact that that the information above on your After Visit Summary has been reviewed and is understood.  Full responsibility of the confidentiality of this discharge information lies with you and/or your care-partner.  

## 2011-03-08 NOTE — Progress Notes (Signed)
Patient did not experience any of the following events: a burn prior to discharge; a fall within the facility; wrong site/side/patient/procedure/implant event; or a hospital transfer or hospital admission upon discharge from the facility. (G8907) Patient did not have preoperative order for IV antibiotic SSI prophylaxis. (G8918)  

## 2011-03-08 NOTE — Op Note (Signed)
[facility] [address] [place], [place]  [address]  COLONOSCOPY PROCEDURE REPORT  PATIENT:  Jasmine Nolan, Jasmine Nolan  MR#:  [id number] BIRTHDATE:  December 12, 1970, 40 yrs. old  GENDER:  female ENDOSCOPIST:  Wilhemina Bonito. Eda Keys, MD REF. BY:  Screening / Recall PROCEDURE DATE:  03/08/2011 PROCEDURE:  Higher-risk screening colonoscopy G0105  ASA CLASS:  Class II INDICATIONS:  colorectal cancer screening, high risk, family history of colon cancer ; father died of metastatic CRC in 41's (patient had negative colonoscopy in 2000) MEDICATIONS:   MAC sedation, administered by CRNA, propofol (Diprivan) 300 mg IV  DESCRIPTION OF PROCEDURE:   After the risks benefits and alternatives of the procedure were thoroughly explained, informed consent was obtained.  Digital rectal exam was performed and revealed no abnormalities.   The LB [id number] endoscope was introduced through the anus and advanced to the cecum, which was identified by both the appendix and ileocecal valve, without limitations.  The quality of the prep was excellent, using MoviPrep.  The instrument was then slowly withdrawn as the colon was fully examined. <<PROCEDUREIMAGES>>  FINDINGS:  A normal appearing cecum, ileocecal valve, and appendiceal orifice were identified. The ascending, hepatic flexure, transverse, splenic flexure, descending, sigmoid colon, and rectum appeared unremarkable.  No polyps or cancers were seen. Retroflexed views in the rectum revealed internal hemorrhoids. The time to cecum = 1:59  minutes. The scope was then withdrawn in 7:37  minutes from the cecum and the procedure completed.  COMPLICATIONS:  None  ENDOSCOPIC IMPRESSION: 1) Normal colonoscopy 2) No polyps or cancers 3) Internal hemorrhoids  RECOMMENDATIONS: 1) Follow up colonoscopy in 5 years (Family hx)  ______________________________ Wilhemina Bonito. Eda Keys, MD  CC:  Ruthe Mannan MD;The Patient  n. Rosalie DoctorWilhemina Bonito. Eda Keys at  03/08/2011 11:21 AM  Phineas Semen, [phone]

## 2011-03-08 NOTE — Progress Notes (Signed)
Propofol per [facility] pre protocol. See scanned intra procedure report. ewm

## 2011-03-09 ENCOUNTER — Telehealth: Payer: Self-pay

## 2011-03-09 NOTE — Telephone Encounter (Signed)
  Follow up Call-  Call back number 03/08/2011  Post procedure Call Back phone  # [phone]  Permission to leave phone message Yes     Patient questions:  Do you have a fever, pain , or abdominal swelling? no Pain Score  0 *  Have you tolerated food without any problems? yes  Have you been able to return to your normal activities? yes  Do you have any questions about your discharge instructions: Diet   no Medications  no Follow up visit  no  Do you have questions or concerns about your Care? no  Actions: * If pain score is 4 or above: No action needed, pain <4. Per the pt everything went okay. Maw

## 2011-03-10 ENCOUNTER — Telehealth: Payer: Self-pay | Admitting: *Deleted

## 2011-03-10 MED ORDER — FLUCONAZOLE 150 MG PO TABS: 150.0000 mg | ORAL_TABLET | Freq: Once | ORAL | Status: AC

## 2011-03-10 NOTE — Telephone Encounter (Signed)
Triage Record Num: [id number] Operator: Frederico Hamman Patient Name: Phineas Semen Call Date & Time: 03/10/2011 4:47:11PM Patient Phone: [phone] PCP: Ruthe Mannan Patient Gender: Female PCP Fax : [phone] Patient DOB: [birth year] Practice Name: [facility] Reason for Call: Caller: Ecko/Patient; PCP: Ruthe Mannan [facility]); CB#: [phone]; ; ; Call regarding Yeast Infection- Told She Would Get A. Call Back by 1PM To Get Medication Called In.; Madelena states she called office this AM and was to receive a call back by 1300. States a prescription was requested for a yeast infection to [facility] [phone]. No meds called in to [facility]. Office notified. Pt states she is going out of town and cannot schedule appt in AM. Call transferred to office to speak with Herbert Seta. office note Protocol(s) Used: Office Note Recommended Outcome per Protocol: Information Noted and Sent to Office Reason for Outcome: Caller information to office Care Advice: ~ 02/

## 2011-03-10 NOTE — Telephone Encounter (Signed)
Sent in.  Tried to call pt, no answer.

## 2011-03-10 NOTE — Telephone Encounter (Signed)
Patient calling having vaginal itching, irritation , burning, and discharge. Patient requesting diflucan to be called in. [facility] [address]. Told patient to check with pharmacy. If she didn't hear anything back.  Patient was offered an appt at [facility] and would not or could not go.

## 2011-06-01 ENCOUNTER — Encounter: Payer: Self-pay | Admitting: Family Medicine

## 2011-06-01 ENCOUNTER — Ambulatory Visit (INDEPENDENT_AMBULATORY_CARE_PROVIDER_SITE_OTHER): Payer: BC Managed Care – PPO | Admitting: Family Medicine

## 2011-06-01 VITALS — BP 132/80 | HR 76 | Temp 97.9°F | Wt 181.0 lb

## 2011-06-01 DIAGNOSIS — L918 Other hypertrophic disorders of the skin: Secondary | ICD-10-CM

## 2011-06-01 DIAGNOSIS — L909 Atrophic disorder of skin, unspecified: Secondary | ICD-10-CM

## 2011-06-01 NOTE — Progress Notes (Signed)
S: The patient complains of symptomatic skin tags on the neck bilaterally. These are irritated by clothing, jewelry and rubbing.  Patient Active Problem List  Diagnoses  . Joint pain  . Arthritis  . Depression  . Obesity  . Exposure to STD  . Sinusitis  . Lateral epicondylitis  of elbow  . Bug bite with infection  . Inguinal mass  . Heart palpitations  . Mitral valve prolapse  . Cutaneous skin tags   Past Medical History  Diagnosis Date  . Depression   . MVP (mitral valve prolapse)   . Arthritis    Past Surgical History  Procedure Date  . Cesarean section 2001  . Lumbar disc surgery June 2011  . Tubal ligation   . Endometrial ablation    History  Substance Use Topics  . Smoking status: Current Some Day Smoker  . Smokeless tobacco: Never Used  . Alcohol Use: Not on file   Family History  Problem Relation Age of Onset  . Cancer Father 1    died of colon CA  . Colon cancer Father   . Depression Mother   . Arthritis Mother    Allergies  Allergen Reactions  . Bactrim Nausea And Vomiting  . Dicyclomine Hcl Rash  . Tetanus Toxoids Other (See Comments)    fever   Current Outpatient Prescriptions on File Prior to Visit  Medication Sig Dispense Refill  . sertraline (ZOLOFT) 100 MG tablet Take 100 mg by mouth 2 (two) times daily.         The PMH, PSH, Social History, Family History, Medications, and allergies have been reviewed in [facility], and have been updated if relevant.  O:  BP 132/80  Pulse 76  Temp(Src) 97.9 F (36.6 C) (Oral)  Wt 181 lb (82.101 kg)  Patient appears well. Several benign skin tags are noted on the neck bilaterally.   A: Skin tags   P: Skin tags are snipped off using Betadine for cleansing and sterile iris scissors. Local anesthesia was t used. These pathognomonic lesions are not sent for pathology.

## 2011-06-14 ENCOUNTER — Encounter: Payer: Self-pay | Admitting: Family Medicine

## 2011-07-26 ENCOUNTER — Ambulatory Visit (INDEPENDENT_AMBULATORY_CARE_PROVIDER_SITE_OTHER): Payer: BC Managed Care – PPO | Admitting: Family Medicine

## 2011-07-26 ENCOUNTER — Telehealth: Payer: Self-pay | Admitting: *Deleted

## 2011-07-26 ENCOUNTER — Encounter: Payer: Self-pay | Admitting: Family Medicine

## 2011-07-26 ENCOUNTER — Ambulatory Visit: Payer: Self-pay | Admitting: Family Medicine

## 2011-07-26 VITALS — BP 122/90 | HR 68 | Temp 97.9°F | Wt 181.0 lb

## 2011-07-26 DIAGNOSIS — R935 Abnormal findings on diagnostic imaging of other abdominal regions, including retroperitoneum: Secondary | ICD-10-CM

## 2011-07-26 DIAGNOSIS — R5381 Other malaise: Secondary | ICD-10-CM

## 2011-07-26 DIAGNOSIS — R1110 Vomiting, unspecified: Secondary | ICD-10-CM

## 2011-07-26 DIAGNOSIS — R5383 Other fatigue: Secondary | ICD-10-CM

## 2011-07-26 LAB — CBC WITH DIFFERENTIAL/PLATELET
Basophils Relative: 0.9 % (ref 0.0–3.0)
Eosinophils Relative: 2.6 % (ref 0.0–5.0)
HCT: 42.8 % (ref 36.0–46.0)
Hemoglobin: 14.1 g/dL (ref 12.0–15.0)
Lymphs Abs: 2.7 10*3/uL (ref 0.7–4.0)
MCV: 90.6 fl (ref 78.0–100.0)
Monocytes Relative: 6.6 % (ref 3.0–12.0)
Neutro Abs: 6.1 10*3/uL (ref 1.4–7.7)
RBC: 4.72 Mil/uL (ref 3.87–5.11)
WBC: 9.8 10*3/uL (ref 4.5–10.5)

## 2011-07-26 LAB — COMPREHENSIVE METABOLIC PANEL
BUN: 10 mg/dL (ref 6–23)
CO2: 27 mEq/L (ref 19–32)
Creatinine, Ser: 0.9 mg/dL (ref 0.4–1.2)
GFR: 74.7 mL/min (ref >60.00)
Glucose, Bld: 84 mg/dL (ref 70–99)
Sodium: 139 mEq/L (ref 135–145)
Total Bilirubin: 0.7 mg/dL (ref 0.3–1.2)
Total Protein: 7.3 g/dL (ref 6.0–8.3)

## 2011-07-26 LAB — TSH: TSH: 1.36 u[IU]/mL (ref 0.35–5.50)

## 2011-07-26 NOTE — Telephone Encounter (Signed)
Advised patient, she is agreeable to CT.  Advised her we will call with information.

## 2011-07-26 NOTE — Patient Instructions (Addendum)
Good to see you. Nexium 40 mg daily for next 2 weeks. Exclude gas producing foods (beans, onions, celery, carrots, raisins, bananas, apricots, prunes, brussel sprouts, wheat germ, pretzels)  Please stop by to see Shirlee Limerick after you go to the lab to set up your ultrasound.

## 2011-07-26 NOTE — Telephone Encounter (Signed)
Noted.  Just received your voicemail as well. Please let pt know that call report of ultrasound consistent with echogenic liver (most likely fatty liver) which often see when someone is overweight- especially with abdominal fat.  Gall bladder and bile ducts normal. Spleen is reported enlarged at 15.1 cm (normal is 12-14).  This is therefore a very mild enlargement but given her symptoms and liver findings (although benign), I would like to get a CT of her abdomen. I will place order.

## 2011-07-26 NOTE — Telephone Encounter (Signed)
Lupita Leash at [facility] called with report on pt's CT.  She has moderately dense echogenic liver and enlarged spleen at 15.1 cm's.  Gallbladder and bile ducts are normal.  Left message on Dr. Elmer Sow voice mail, as well, to advise her.

## 2011-07-26 NOTE — Progress Notes (Signed)
41 yo here for 3 weeks of intermittent vomiting and fatigue.  Has noticed worsening reflux symptoms at night- burning acid in throat.  Several times, when she rolls on stomach in middle of the night, she vomits. Not nauseated during the day but she is very fatigued. No abdominal pain.  No diarrhea or blood in stool- had a colonoscopy in 02/2011.  Does not feel like she is belching more than using.  No morning cough or scratchy throat.  Sleeping well at night but feel likes like she is never well rested. Denies any other symptoms of hypo or hyperthyroidism. Denies feeling depressed.  Patient Active Problem List  Diagnosis  . Joint pain  . Arthritis  . Depression  . Obesity  . Exposure to STD  . Sinusitis  . Lateral epicondylitis  of elbow  . Bug bite with infection  . Inguinal mass  . Heart palpitations  . Mitral valve prolapse  . Cutaneous skin tags  . Fatigue  . Vomiting   Past Medical History  Diagnosis Date  . Depression   . MVP (mitral valve prolapse)   . Arthritis    Past Surgical History  Procedure Date  . Cesarean section 2001  . Lumbar disc surgery June 2011  . Tubal ligation   . Endometrial ablation    History  Substance Use Topics  . Smoking status: Current Some Day Smoker  . Smokeless tobacco: Never Used  . Alcohol Use: Not on file   Family History  Problem Relation Age of Onset  . Cancer Father 59    died of colon CA  . Colon cancer Father   . Depression Mother   . Arthritis Mother    Allergies  Allergen Reactions  . Bactrim Nausea And Vomiting  . Dicyclomine Hcl Rash  . Tetanus Toxoids Other (See Comments)    fever   Current Outpatient Prescriptions on File Prior to Visit  Medication Sig Dispense Refill  . sertraline (ZOLOFT) 100 MG tablet Take 100 mg by mouth 2 (two) times daily.         The PMH, PSH, Social History, Family History, Medications, and allergies have been reviewed in [facility], and have been updated if relevant.   The PMH,  PSH, Social History, Family History, Medications, and allergies have been reviewed in [facility], and have been updated if relevant.  ROS: See HPI No fevers No difficulty swallowing- foods or liquids   No HA No blurred vision  Physical exam: BP 122/90  Pulse 68  Temp 97.9 F (36.6 C)  Wt 181 lb (82.101 kg)  General:  overweight,well-nourished,in no acute distress; alert,appropriate and cooperative throughout examination Head:  normocephalic and atraumatic.   Eyes:  vision grossly intact, pupils equal, pupils round, and pupils reactive to light.   Ears:  R ear normal and L ear normal.   Nose:  no external deformity.   Mouth:  good dentition.   Neck:  No deformities, masses, or tenderness noted.  Lungs:  Normal respiratory effort, chest expands symmetrically. Lungs are clear to auscultation, no crackles or wheezes. Heart:  Normal rate and regular rhythm. S1 and S2 normal without gallop, murmur, click, rub or other extra sounds. Abdomen:  Bowel sounds positive,abdomen soft and non-tender without masses, organomegaly or hernias noted. Neg murphy's sign Msk:  No deformity or scoliosis noted of thoracic or lumbar spine.   Extremities:  No clubbing, cyanosis, edema, or deformity noted with normal full range of motion of all joints.   Neurologic:  alert &  oriented X3 and gait normal.   Skin:  Intact without suspicious lesions or rashes Psych:  Cognition and judgment appear intact. Alert and cooperative with normal attention span and concentration. No apparent delusions, illusions, hallucinations  Assessment and Plan:  1. Fatigue  New- ?related to #2 since she is up vomiting at night and therefore likely not sleeping as well as she thinks she is. CBC with Differential, Comprehensive metabolic panel, TSH  2. Vomiting  New- exam unremarkable. ? Severe GERD Will try Nexium 40 mg nightly x 2 weeks- exclude acidic and gas producing foods (see pt instructions). U/s of abdomen to rule out biliary  source although not classic symptoms of biliary colic. If ultrasound and labs unremarkable, refer to Dr. Marina Goodell (her GI doc) for endoscopy. Marland Kitchenagere  US Abdomen Complete

## 2011-07-27 ENCOUNTER — Encounter: Payer: Self-pay | Admitting: Family Medicine

## 2011-07-28 NOTE — Telephone Encounter (Signed)
CT set up at [facility] for July 18th at 4pm. [facility]

## 2011-08-03 ENCOUNTER — Ambulatory Visit (INDEPENDENT_AMBULATORY_CARE_PROVIDER_SITE_OTHER)
Admission: RE | Admit: 2011-08-03 | Discharge: 2011-08-03 | Disposition: A | Discharge status: Home / Self Care | Payer: BC Managed Care – PPO | Source: Ambulatory Visit | Attending: Family Medicine | Admitting: Family Medicine

## 2011-08-03 DIAGNOSIS — R935 Abnormal findings on diagnostic imaging of other abdominal regions, including retroperitoneum: Secondary | ICD-10-CM

## 2011-08-03 DIAGNOSIS — R1110 Vomiting, unspecified: Secondary | ICD-10-CM

## 2011-08-03 MED ORDER — IOHEXOL 300 MG/ML  SOLN
100.0000 mL | Freq: Once | INTRAMUSCULAR | Status: AC | PRN
  Administered 2011-08-03: 100 mL via INTRAVENOUS

## 2011-08-04 ENCOUNTER — Telehealth: Payer: Self-pay

## 2011-08-04 MED ORDER — PROMETHAZINE HCL 25 MG PO TABS: 25.0000 mg | ORAL_TABLET | Freq: Four times a day (QID) | ORAL | Status: DC | PRN

## 2011-08-04 NOTE — Telephone Encounter (Signed)
I am unfortunately unable to see her today as I am leaving the office. Please have her go to Urgent care or ER as it sounds like she really needs to be evaluated tonight.

## 2011-08-04 NOTE — Telephone Encounter (Signed)
Advised patient.  She says she really just wants to lie down and is asking if something for nausea can be called to [employer].

## 2011-08-04 NOTE — Telephone Encounter (Signed)
Advised patient.  She said she will go to urgent care tomorrow if not better.

## 2011-08-04 NOTE — Telephone Encounter (Signed)
I will call in small amount of nausea med, but if not improving  In 24 hours.. Go to urgent care.

## 2011-08-04 NOTE — Telephone Encounter (Signed)
Pt had CT of abdomen yesterday; during test felt ok; 45-60 mins after CT began vomiting. Last vomited last night(pt not sure what time). Today very nauseated(has not eaten today) but has not vomited but feels terrible. Pt not sure if needs to be seen but only wants to see Dr Dayton Martes if has to be seen.[employer].Please advise.

## 2011-08-10 ENCOUNTER — Ambulatory Visit: Payer: BC Managed Care – PPO | Admitting: Family Medicine

## 2011-09-29 ENCOUNTER — Ambulatory Visit: Payer: BC Managed Care – PPO | Admitting: Internal Medicine

## 2011-10-11 ENCOUNTER — Encounter: Payer: Self-pay | Admitting: Family Medicine

## 2011-10-11 ENCOUNTER — Ambulatory Visit (INDEPENDENT_AMBULATORY_CARE_PROVIDER_SITE_OTHER): Payer: No Typology Code available for payment source | Admitting: Family Medicine

## 2011-10-11 VITALS — BP 124/86 | HR 64 | Temp 98.1°F | Wt 170.0 lb

## 2011-10-11 DIAGNOSIS — F410 Panic disorder [episodic paroxysmal anxiety] without agoraphobia: Secondary | ICD-10-CM

## 2011-10-11 DIAGNOSIS — L237 Allergic contact dermatitis due to plants, except food: Secondary | ICD-10-CM

## 2011-10-11 DIAGNOSIS — L255 Unspecified contact dermatitis due to plants, except food: Secondary | ICD-10-CM

## 2011-10-11 MED ORDER — ALPRAZOLAM 0.25 MG PO TABS: 0.2500 mg | ORAL_TABLET | Freq: Three times a day (TID) | ORAL | Status: AC | PRN

## 2011-10-11 MED ORDER — DEXAMETHASONE SOD PHOSPHATE PF 10 MG/ML IJ SOLN
10.0000 mg | Freq: Once | INTRAMUSCULAR | Status: AC
  Administered 2011-10-11: 10 mg via INTRAMUSCULAR

## 2011-10-11 NOTE — Progress Notes (Signed)
Subjective:    Patient ID: Jasmine Nolan, female    DOB: [birth year], 41 y.o.   MRN: [id number]  HPI  Very pleasant 41 yo female well know to me here for:  1.  ?poison oak.   [place]  Several days later, several raised, itchy areas on back of neck, abdomen and right flank. Benadryl does help. Has had poison ivy and poison oak in past and this feels similar to her.  2.  Panic attacks. Still taking Zoloft but has not needed to take xanax in years. Job is becoming more stressful, uncle recently died and she is concerned about her daughter.  Has been having panic attacks past few weeks. She denies feeling depressed. No SI or HI. Appetite ok, not sleeping well. Patient Active Problem List  Diagnosis  . Joint pain  . Arthritis  . Depression  . Obesity  . Exposure to STD  . Sinusitis  . Lateral epicondylitis  of elbow  . Bug bite with infection  . Inguinal mass  . Heart palpitations  . Mitral valve prolapse  . Cutaneous skin tags  . Fatigue  . Vomiting  . Poison oak  . Panic attacks   Past Medical History  Diagnosis Date  . Depression   . MVP (mitral valve prolapse)   . Arthritis   . Obesity   . Sinusitis   . Lateral epicondylitis of elbow   . Inguinal mass    Past Surgical History  Procedure Date  . Cesarean section 2001  . Lumbar disc surgery June 2011  . Tubal ligation   . Endometrial ablation    History  Substance Use Topics  . Smoking status: Current Some Day Smoker  . Smokeless tobacco: Never Used  . Alcohol Use: Not on file   Family History  Problem Relation Age of Onset  . Colon cancer Father 53    died of colon CA  . Depression Mother   . Arthritis Mother    Allergies  Allergen Reactions  . Bactrim Nausea And Vomiting  . Dicyclomine Hcl Rash  . Tetanus Toxoids Other (See Comments)    fever   Current Outpatient Prescriptions on File Prior to Visit  Medication Sig Dispense Refill  . sertraline  (ZOLOFT) 100 MG tablet Take 100 mg by mouth 2 (two) times daily.         No current facility-administered medications on file prior to visit.   The PMH, PSH, Social History, Family History, Medications, and allergies have been reviewed in [facility], and have been updated if relevant.   Review of Systems See HPI    Objective:   Physical Exam BP 124/86  Pulse 64  Temp 98.1 F (36.7 C)  Wt 170 lb (77.111 kg) Gen:  Alert, pleasant, NAD Skin:  Multiple linear raised erythematous lesions on back of neck, left lower abdomen, right right lateral thigh Psych:  Tearful, moderately anxious, appropriate, good eye contact.     Assessment & Plan:   1. Poison oak  New- rash does appear consistent with an allergic contact dermatitis.  She has a tube of clobetasol in her purse that she has. Advised to start applying it twice daily until rash resolved.  IM decadron given in office to calm inflammation. Continue antihistamines as needed for itching. dexamethasone Sod Phosphate PF SOLN 10 mg  2. Panic attacks  Deteriorated. Continue zoloft, restart xanax. The patient indicates understanding of these issues and agrees with  the plan.

## 2011-10-11 NOTE — Patient Instructions (Addendum)
Good to see you. Hang in there- I'm sorry about what you're going through.  Please use the clobetasol on your rash.  Let's restart the xanax.  Call me next week with an update.

## 2011-10-30 ENCOUNTER — Encounter: Payer: Self-pay | Admitting: Family Medicine

## 2011-11-10 ENCOUNTER — Ambulatory Visit: Payer: BC Managed Care – PPO | Admitting: Internal Medicine

## 2011-11-23 ENCOUNTER — Ambulatory Visit (INDEPENDENT_AMBULATORY_CARE_PROVIDER_SITE_OTHER): Payer: BC Managed Care – PPO | Admitting: Family Medicine

## 2011-11-23 ENCOUNTER — Encounter: Payer: Self-pay | Admitting: Family Medicine

## 2011-11-23 VITALS — BP 122/80 | HR 92 | Temp 98.4°F | Wt 165.0 lb

## 2011-11-23 DIAGNOSIS — M549 Dorsalgia, unspecified: Secondary | ICD-10-CM | POA: Insufficient documentation

## 2011-11-23 LAB — POCT URINALYSIS DIPSTICK
Glucose, UA: NEGATIVE
Ketones, UA: NEGATIVE
Leukocytes, UA: NEGATIVE
Spec Grav, UA: 1.02

## 2011-11-23 MED ORDER — CIPROFLOXACIN HCL 250 MG PO TABS: 250.0000 mg | ORAL_TABLET | Freq: Two times a day (BID) | ORAL | Status: DC

## 2011-11-23 NOTE — Patient Instructions (Signed)
Drink plenty of water and start the antibiotics if the pain gets worse or if you have changes in urination.  We'll contact you with your lab report.  Take care.

## 2011-11-23 NOTE — Progress Notes (Signed)
Dysuria: no burning, odor, frequency or other sx but she has had UTI w/o sx prev, during hospital admission duration of symptoms: about 1 week abdominal pain:no fevers:no back pain: lower back pain, bilateral in midaxillary line near the waist vomiting:no other concerns: h/o UTI with similar sx prev She didn't think she had pulled a muscle, no trauma.    Meds, vitals, and allergies reviewed.   ROS: See HPI.  Otherwise negative.    GEN: nad, alert and oriented HEENT: mucous membranes moist NECK: supple CV: rrr.  PULM: ctab, no inc wob ABD: soft, +bs, suprapubic area not tender EXT: no edema SKIN: no acute rash BACK: no CVA pain

## 2011-11-24 NOTE — Assessment & Plan Note (Signed)
Possible uti, but no dysuria.  Nontoxic.  Hold abx for now, check ucx.  Start abx if ucx pos or dysuria.  She agrees.  F/u prn.

## 2011-11-25 LAB — URINE CULTURE
Colony Count: NO GROWTH
Organism ID, Bacteria: NO GROWTH

## 2011-12-20 ENCOUNTER — Ambulatory Visit (INDEPENDENT_AMBULATORY_CARE_PROVIDER_SITE_OTHER): Payer: No Typology Code available for payment source | Admitting: Family Medicine

## 2011-12-20 ENCOUNTER — Encounter: Payer: Self-pay | Admitting: Family Medicine

## 2011-12-20 VITALS — BP 108/68 | HR 76 | Temp 98.9°F | Wt 166.5 lb

## 2011-12-20 DIAGNOSIS — M7710 Lateral epicondylitis, unspecified elbow: Secondary | ICD-10-CM

## 2011-12-20 NOTE — Assessment & Plan Note (Signed)
Anatomy d/w pt.  Handout given re: exercises and treatment.  Use a strap in meantime- she dec in pain with resisted supination with compression of the forearm distal to lateral epicondyle.  She understood the plan.

## 2011-12-20 NOTE — Progress Notes (Signed)
No known injury.  H/o neck surgery 2 years ago.  In last few weeks, she had a nagging pain near the elbow, lateral epicondyle.  She tried aleve, ice w/o help. Pain with gripping and picking up objects. Gradually getting worse. No pain on medial epicondyle.  Right handed.  No L arm symptoms.    Meds, vitals, and allergies reviewed.   ROS: See HPI.  Otherwise, noncontributory.  nad ncat Normal R shoulder, elbow, wrist ROM Normal inspection of elbow, not red or puffy ttp at lateral epicondyle Pain with resisted supination  Distally nv intact

## 2011-12-20 NOTE — Patient Instructions (Addendum)
Get a tennis elbow brace and wear it as much as you can during the day.  Gradually work on the exercises on the hand out (as long as you can do it without pain). Tylenol for pain in meantime. Take care.

## 2012-01-30 ENCOUNTER — Encounter: Payer: Self-pay | Admitting: Family Medicine

## 2012-01-30 ENCOUNTER — Ambulatory Visit (INDEPENDENT_AMBULATORY_CARE_PROVIDER_SITE_OTHER): Payer: No Typology Code available for payment source | Admitting: Family Medicine

## 2012-01-30 VITALS — BP 126/86 | HR 98 | Temp 98.6°F | Wt 170.0 lb

## 2012-01-30 DIAGNOSIS — R319 Hematuria, unspecified: Secondary | ICD-10-CM

## 2012-01-30 DIAGNOSIS — N390 Urinary tract infection, site not specified: Secondary | ICD-10-CM

## 2012-01-30 LAB — POCT URINALYSIS DIPSTICK
Glucose, UA: NEGATIVE
Nitrite, UA: NEGATIVE
Urobilinogen, UA: NEGATIVE

## 2012-01-30 MED ORDER — CIPROFLOXACIN HCL 250 MG PO TABS: 250.0000 mg | ORAL_TABLET | Freq: Two times a day (BID) | ORAL | Status: DC

## 2012-01-30 NOTE — Assessment & Plan Note (Signed)
Ucx, cipro, otc azo, fluids and f/u prn.  She agrees. Nontoxic.

## 2012-01-30 NOTE — Patient Instructions (Addendum)
Take OTC AZO.  Drink plenty of water and start the antibiotics today.  We'll contact you with your lab report.  Take care.

## 2012-01-30 NOTE — Progress Notes (Signed)
Still with some R elbow pain, but improved when wearing a tennis elbow brace.    dysuria:yes duration of symptoms: today abdominal pain:yes fevers:no back pain:some lower back pain Vomiting:no ucx pending.   Meds, vitals, and allergies reviewed.   ROS: See HPI.  Otherwise negative.    GEN: nad, alert and oriented HEENT: mucous membranes moist NECK: supple CV: rrr.  PULM: ctab, no inc wob ABD: soft, +bs, suprapubic area tender EXT: no edema BACK: no CVA pain

## 2012-02-02 LAB — URINE CULTURE: Colony Count: >=100000

## 2012-03-02 ENCOUNTER — Other Ambulatory Visit: Payer: Self-pay

## 2012-04-24 ENCOUNTER — Telehealth: Payer: Self-pay | Admitting: Family Medicine

## 2012-04-24 NOTE — Telephone Encounter (Signed)
Left message asking patient to call back with update.  I will call her back tomorrow if we dont hear from her.

## 2012-04-24 NOTE — Telephone Encounter (Signed)
Please call to check on pt. 

## 2012-04-24 NOTE — Telephone Encounter (Signed)
Patient Information:  Caller Name: Elsey  Phone: [phone]  Patient: Jasmine Nolan  Gender: Female  DOB: April 25, 1970  Age: 42 Years  PCP: Ruthe Mannan [facility])  Pregnant: No  Office Follow Up:  Does the office need to follow up with this patient?: Yes  Instructions For The Office: Patient advised to call 911 for transport to ED.  RN Note:  LMP: Hx: Endometrial ablation. Patient states she developed mid sternal chest discomfort, onset 04/23/12. States pain was intermittent 04/23/12. Patient states pain has been constant since 1000 04/24/12. Denies nausea, dyspnea, diaphoresis or dizziness. Patient describes discomfort as " squeezing." States pain is at a " 7" on 1-10 scale. Care advice given per guidelines. Patient advised to call 911. Patient states she is currently driving. Patient advised to pull off of road and call 911 for transport to ED. Patient advised to chew one 325mg . Aspirin after calling 911. Patient is reluctant to call 911. Patient is requesting an appt. Possible consequences of delaying care, including loss of life, explained to patient. Patient verbalizes understanding. Patient states, " ok, I will decide what I am going to do." RN strongly advised that patient call 911. Patient verbalizes understanding.  Symptoms  Reason For Call & Symptoms: Chest Pain  Reviewed Health History In EMR: Yes  Reviewed Medications In EMR: Yes  Reviewed Allergies In EMR: Yes  Reviewed Surgeries / Procedures: Yes  Date of Onset of Symptoms: 04/23/2012 OB / GYN:  LMP: Unknown  Guideline(s) Used:  Chest Pain  Disposition Per Guideline:   Call EMS 911 Now  Reason For Disposition Reached:   Chest pain lasting longer than 5 minutes and ANY of the following:  Over 74 years old Over 100 years old and at least one cardiac risk factor (i.e., high blood pressure, diabetes, high cholesterol, obesity, smoker or strong family history of heart disease) Pain is crushing, pressure-like, or  heavy  Took nitroglycerin and chest pain was not relieved History of heart disease (i.e., angina, heart attack, bypass surgery, angioplasty, CHF)  Advice Given:  N/A  Patient Refused Recommendation:  Patient Refused Care Advice  Patient states, " ok, I will decide what I am going to do."

## 2012-04-25 ENCOUNTER — Encounter: Payer: Self-pay | Admitting: Family Medicine

## 2012-04-25 ENCOUNTER — Ambulatory Visit (INDEPENDENT_AMBULATORY_CARE_PROVIDER_SITE_OTHER): Payer: No Typology Code available for payment source | Admitting: Family Medicine

## 2012-04-25 VITALS — BP 110/64 | HR 63 | Temp 98.3°F | Ht 64.0 in | Wt 171.2 lb

## 2012-04-25 DIAGNOSIS — R0789 Other chest pain: Secondary | ICD-10-CM | POA: Insufficient documentation

## 2012-04-25 DIAGNOSIS — K219 Gastro-esophageal reflux disease without esophagitis: Secondary | ICD-10-CM

## 2012-04-25 DIAGNOSIS — R079 Chest pain, unspecified: Secondary | ICD-10-CM

## 2012-04-25 DIAGNOSIS — Z13220 Encounter for screening for lipoid disorders: Secondary | ICD-10-CM

## 2012-04-25 DIAGNOSIS — F410 Panic disorder [episodic paroxysmal anxiety] without agoraphobia: Secondary | ICD-10-CM

## 2012-04-25 NOTE — Patient Instructions (Addendum)
Work on stress reduction, relaxation. Start omeprazole (prilosec OTC) 2 x 20 mg tablets daily.  Avoid spicy foods, caffeine, soda, chocolate, alcohol,tomato, citris. Follow up with Dr. Dayton Martes if anxiety and or chest pain continuing in 2 weeks to consider changing sertraline to different medication.

## 2012-04-25 NOTE — Assessment & Plan Note (Signed)
Start onmeprazole 40 mg daily, avoid triggers.

## 2012-04-25 NOTE — Assessment & Plan Note (Addendum)
Likely due to GERD and anxiety. EKG NSR.  Low risk for CAD ( nonsmoker, no family hx)... Overdue for cholesterol and DM screen.. Will send.

## 2012-04-25 NOTE — Assessment & Plan Note (Signed)
Inadequate control. Pt hesitant about changing meds... Will have her follow up with PCP to consider different antidepressant

## 2012-04-25 NOTE — Progress Notes (Signed)
  Subjective:    Patient ID: Jasmine Nolan, female    DOB: [birth year], 42 y.o.   MRN: [id number]  HPI 42 year old female with history of panic attacks, depression and anxiety presents with worsening of symptoms.  She has been noting chest pain, panicky symptoms. Started 2 days ago, intermittant, central substernal. Occuring at rest.  Lasts hours at a time. No relationship to meals. She is having some soreness still on left lateral chest wall today. Mild SOB ( hyperventilates), no palpitations.  Alprazolam helped some but not much.   She also has been having some indigestion.  Has been under a lot of stress lately.  She has history of mitral valve prolapse.  On sertraline 100 mg 2 tabs daily, has never been under great control.  No SI. On alprazolam every other day.  Review of Systems  Constitutional: Negative for fever and fatigue.  HENT: Negative for ear pain.   Eyes: Negative for pain.  Respiratory: Negative for chest tightness and shortness of breath.   Cardiovascular: Positive for chest pain. Negative for palpitations and leg swelling.  Gastrointestinal: Negative for abdominal pain.  Genitourinary: Negative for dysuria.       Objective:   Physical Exam  Constitutional: Vital signs are normal. She appears well-developed and well-nourished. She is cooperative.  Non-toxic appearance. She does not appear ill. No distress.  HENT:  Head: Normocephalic.  Right Ear: Hearing, tympanic membrane, external ear and ear canal normal. Tympanic membrane is not erythematous, not retracted and not bulging.  Left Ear: Hearing, tympanic membrane, external ear and ear canal normal. Tympanic membrane is not erythematous, not retracted and not bulging.  Nose: No mucosal edema or rhinorrhea. Right sinus exhibits no maxillary sinus tenderness and no frontal sinus tenderness. Left sinus exhibits no maxillary sinus tenderness and no frontal sinus tenderness.  Mouth/Throat: Uvula is midline, oropharynx  is clear and moist and mucous membranes are normal.  Eyes: Conjunctivae, EOM and lids are normal. Pupils are equal, round, and reactive to light. No foreign bodies found.  Neck: Trachea normal and normal range of motion. Neck supple. Carotid bruit is not present. No mass and no thyromegaly present.  Cardiovascular: Normal rate, regular rhythm, S1 normal, S2 normal, normal heart sounds, intact distal pulses and normal pulses.  Exam reveals no gallop and no friction rub.   No murmur heard. Pulmonary/Chest: Effort normal and breath sounds normal. Not tachypneic. No respiratory distress. She has no decreased breath sounds. She has no wheezes. She has no rhonchi. She has no rales.  Abdominal: Soft. Normal appearance and bowel sounds are normal. There is no tenderness.  Neurological: She is alert.  Skin: Skin is warm, dry and intact. No rash noted.  Psychiatric: Her speech is normal and behavior is normal. Judgment and thought content normal. Her mood appears not anxious. Cognition and memory are normal. She does not exhibit a depressed mood.  Non tenderness to palpation of chest wall.        Assessment & Plan:

## 2012-04-26 ENCOUNTER — Other Ambulatory Visit (INDEPENDENT_AMBULATORY_CARE_PROVIDER_SITE_OTHER): Payer: BC Managed Care – PPO

## 2012-04-26 DIAGNOSIS — Z13220 Encounter for screening for lipoid disorders: Secondary | ICD-10-CM

## 2012-04-26 LAB — COMPREHENSIVE METABOLIC PANEL
ALT: 21 U/L (ref 0–35)
AST: 20 U/L (ref 0–37)
Alkaline Phosphatase: 66 U/L (ref 39–117)
Creatinine, Ser: 1.1 mg/dL (ref 0.4–1.2)
GFR: 60.05 mL/min (ref >60.00)
Total Bilirubin: 0.9 mg/dL (ref 0.3–1.2)

## 2012-04-26 LAB — LIPID PANEL
HDL: 30.2 mg/dL — ABNORMAL LOW (ref >39.00)
Total CHOL/HDL Ratio: 7
VLDL: 47.6 mg/dL — ABNORMAL HIGH (ref 0.0–40.0)

## 2012-04-26 LAB — LDL CHOLESTEROL, DIRECT: Direct LDL: 151.7 mg/dL

## 2012-05-02 ENCOUNTER — Ambulatory Visit (INDEPENDENT_AMBULATORY_CARE_PROVIDER_SITE_OTHER): Payer: No Typology Code available for payment source | Admitting: Family Medicine

## 2012-05-02 ENCOUNTER — Encounter: Payer: Self-pay | Admitting: Family Medicine

## 2012-05-02 VITALS — BP 122/80 | HR 76 | Temp 97.6°F | Wt 173.0 lb

## 2012-05-02 DIAGNOSIS — F410 Panic disorder [episodic paroxysmal anxiety] without agoraphobia: Secondary | ICD-10-CM

## 2012-05-02 DIAGNOSIS — N390 Urinary tract infection, site not specified: Secondary | ICD-10-CM

## 2012-05-02 MED ORDER — ALPRAZOLAM 0.5 MG PO TABS: 0.5000 mg | ORAL_TABLET | Freq: Every evening | ORAL | Status: DC | PRN

## 2012-05-02 NOTE — Progress Notes (Signed)
42 yo here for follow up panic attacks.  Saw Dr. Ermalene Searing on 04/25/12 for chest pain and increased anxiety.  On Zoloft 200 mg daily, xanax 0.25 mg qhs as needed.  Feels better now- it's mostly related to work related stressors.  She feels she is handling it a little better and does not want to change her medications.    Has h/o MVP and recurrent palpitations. Has worn several holter monitors, unremarkable.  No CP or SOB. No nausea or diaphoresis.  Wants recheck her urine today.  Had some hematuria and refilled rx for cipro. Feels much better now. Patient Active Problem List  Diagnosis  . Joint pain  . Arthritis  . Depression  . Obesity  . Exposure to STD  . Sinusitis  . Lateral epicondylitis  of elbow  . Bug bite with infection  . Inguinal mass  . Heart palpitations  . Mitral valve prolapse  . Cutaneous skin tags  . Fatigue  . Vomiting  . Poison oak  . Panic attacks  . Back pain  . UTI (lower urinary tract infection)  . Chest pain, atypical  . GERD (gastroesophageal reflux disease)   Past Medical History  Diagnosis Date  . Depression   . MVP (mitral valve prolapse)   . Arthritis   . Obesity   . Sinusitis   . Lateral epicondylitis of elbow   . Inguinal mass    Past Surgical History  Procedure Laterality Date  . Cesarean section  2001  . Lumbar disc surgery  June 2011  . Tubal ligation    . Endometrial ablation     History  Substance Use Topics  . Smoking status: Former Games developer  . Smokeless tobacco: Never Used  . Alcohol Use: Yes     Comment: rarely   Family History  Problem Relation Age of Onset  . Colon cancer Father 15    died of colon CA  . Depression Mother   . Arthritis Mother    Allergies  Allergen Reactions  . Bactrim Nausea And Vomiting  . Dicyclomine Hcl Rash  . Tetanus Toxoids Other (See Comments)    fever   Current Outpatient Prescriptions on File Prior to Visit  Medication Sig Dispense Refill  . sertraline (ZOLOFT) 100 MG tablet  Take 100 mg by mouth 2 (two) times daily.         No current facility-administered medications on file prior to visit.   The PMH, PSH, Social History, Family History, Medications, and allergies have been reviewed in [facility], and have been updated if relevant.   The PMH, PSH, Social History, Family History, Medications, and allergies have been reviewed in [facility], and have been updated if relevant.  ROS: See HPI No SI or HI No dysuria  Physical exam: BP 122/80  Pulse 76  Temp(Src) 97.6 F (36.4 C)  Wt 173 lb (78.472 kg)  BMI 29.68 kg/m2  General:  overweight,well-nourished,in no acute distress; alert,appropriate and cooperative throughout examination Head:  normocephalic and atraumatic.   Eyes:  vision grossly intact, pupils equal, pupils round, and pupils reactive to light.   Ears:  R ear normal and L ear normal.   Nose:  no external deformity.   Mouth:  good dentition.   Neck:  No deformities, masses, or tenderness noted.  Lungs:  Normal respiratory effort, chest expands symmetrically. Lungs are clear to auscultation, no crackles or wheezes. Heart:  Normal rate and regular rhythm. S1 and S2 normal without gallop, murmur, click, rub or other extra  sounds. Abdomen:  Bowel sounds positive,abdomen soft and non-tender without masses, organomegaly or hernias noted. Msk:  No deformity or scoliosis noted of thoracic or lumbar spine.   Extremities:  No clubbing, cyanosis, edema, or deformity noted with normal full range of motion of all joints.   Neurologic:  alert & oriented X3 and gait normal.   Skin:  Intact without suspicious lesions or rashes Psych:  Cognition and judgment appear intact. Alert and cooperative with normal attention span and concentration. No apparent delusions, illusions, hallucinations  Assessment and Plan:  1. Panic attacks Improved.  She does not want to increase dose of Zoloft.  Will continue current dose and increase Xanax to 0.5 mg qhs prn.  Call or return to  clinic prn if these symptoms worsen or fail to improve as anticipated. The patient indicates understanding of these issues and agrees with the plan.   2. UTI (lower urinary tract infection) UA neg.  Pt reassured.

## 2012-05-02 NOTE — Patient Instructions (Addendum)
Good to see you. Hang in there. Have a good weekend. We are increasing your dose of Xanax.  Keep me posted.

## 2012-05-21 ENCOUNTER — Encounter: Payer: Self-pay | Admitting: Family Medicine

## 2012-05-21 ENCOUNTER — Ambulatory Visit (INDEPENDENT_AMBULATORY_CARE_PROVIDER_SITE_OTHER): Payer: No Typology Code available for payment source | Admitting: Family Medicine

## 2012-05-21 VITALS — BP 114/70 | HR 88 | Temp 98.5°F | Wt 174.8 lb

## 2012-05-21 DIAGNOSIS — J329 Chronic sinusitis, unspecified: Secondary | ICD-10-CM

## 2012-05-21 MED ORDER — HYDROCOD POLST-CHLORPHEN POLST 10-8 MG/5ML PO LQCR: 5.0000 mL | Freq: Every evening | ORAL | Status: DC | PRN

## 2012-05-21 MED ORDER — CEFUROXIME AXETIL 250 MG PO TABS: 250.0000 mg | ORAL_TABLET | Freq: Two times a day (BID) | ORAL | Status: DC

## 2012-05-21 NOTE — Patient Instructions (Addendum)
Take antibiotic as directed- Ceftin 250 mg twice daily x 10 days.  Drink lots of fluids.    Treat sympotmatically with Mucinex, nasal saline irrigation, and Tylenol/Ibuprofen.  Try over the counter nasocort-start with 2 sprays per nostril per day...and then try to taper to 1 spray per nostril once symptoms improve.   You can use warm compresses.  Cough suppressant at night.   Call if not improving as expected in 5-7 days.

## 2012-05-21 NOTE — Progress Notes (Signed)
SUBJECTIVE:  Jasmine Nolan is a 42 y.o. female who complains of coryza, congestion, sneezing, sore throat and bilateral sinus pain for 10 days. She denies a history of anorexia, chest pain and chills and denies a history of asthma. Patient denies smoke cigarettes.  Was given Augmentin in March by ENT for sinusitis.  Patient Active Problem List   Diagnosis Date Noted  . Chest pain, atypical 04/25/2012  . GERD (gastroesophageal reflux disease) 04/25/2012  . Back pain 11/23/2011  . Panic attacks 10/11/2011  . Fatigue 07/26/2011  . Cutaneous skin tags 06/01/2011  . Heart palpitations 01/26/2011  . Mitral valve prolapse 01/26/2011  . Inguinal mass 12/26/2010  . Lateral epicondylitis  of elbow 10/27/2010  . Exposure to STD 10/13/2010  . Sinusitis 10/13/2010  . Joint pain 06/06/2010  . Obesity 06/06/2010  . Arthritis   . Depression    Past Medical History  Diagnosis Date  . Depression   . MVP (mitral valve prolapse)   . Arthritis   . Obesity   . Sinusitis   . Lateral epicondylitis of elbow   . Inguinal mass    Past Surgical History  Procedure Laterality Date  . Cesarean section  2001  . Lumbar disc surgery  June 2011  . Tubal ligation    . Endometrial ablation     History  Substance Use Topics  . Smoking status: Former Games developer  . Smokeless tobacco: Never Used  . Alcohol Use: Yes     Comment: rarely   Family History  Problem Relation Age of Onset  . Colon cancer Father 30    died of colon CA  . Depression Mother   . Arthritis Mother    Allergies  Allergen Reactions  . Bactrim Nausea And Vomiting  . Dicyclomine Hcl Rash  . Tetanus Toxoids Other (See Comments)    fever   Current Outpatient Prescriptions on File Prior to Visit  Medication Sig Dispense Refill  . ALPRAZolam (XANAX) 0.5 MG tablet Take 1 tablet (0.5 mg total) by mouth at bedtime as needed.  30 tablet  0  . sertraline (ZOLOFT) 100 MG tablet Take 100 mg by mouth 2 (two) times daily.         No current  facility-administered medications on file prior to visit.   The PMH, PSH, Social History, Family History, Medications, and allergies have been reviewed in [facility], and have been updated if relevant.  OBJECTIVE: BP 114/70  Pulse 88  Temp(Src) 98.5 F (36.9 C) (Oral)  Wt 174 lb 12 oz (79.266 kg)  BMI 29.98 kg/m2  She appears well, vital signs are as noted. Ears normal.  Throat and pharynx normal.  Neck supple. No adenopathy in the neck. Nose is congested. Sinuses tender. The chest is clear, without wheezes or rales.  ASSESSMENT:  sinusitis  PLAN: Given duration and progression of symptoms, will treat for bacterial sinusitis with Ceftin.  Symptomatic therapy suggested: push fluids, rest and return office visit prn if symptoms persist or worsen. Call or return to clinic prn if these symptoms worsen or fail to improve as anticipated.

## 2012-05-27 ENCOUNTER — Telehealth: Payer: Self-pay | Admitting: Family Medicine

## 2012-05-27 MED ORDER — AZITHROMYCIN 250 MG PO TABS: ORAL_TABLET | ORAL | Status: DC

## 2012-05-27 MED ORDER — FLUCONAZOLE 150 MG PO TABS: 150.0000 mg | ORAL_TABLET | Freq: Once | ORAL | Status: DC

## 2012-05-27 NOTE — Telephone Encounter (Signed)
Advised patient that scripts have been sent to [facility].

## 2012-05-27 NOTE — Telephone Encounter (Signed)
Rx for diflucan and zpack sent to her pharmacy.

## 2012-05-27 NOTE — Telephone Encounter (Signed)
Patient Information:  Caller Name: Jasmine Nolan  Phone: [phone]  Patient: Jasmine Nolan  Gender: Female  DOB: 10/20/1970  Age: 42 Years  PCP: Ruthe Mannan [facility]  Pregnant: No  Office Follow Up:  Does the office need to follow up with this patient?: Yes  Instructions For The Office: requests Rx  RN Note:  Patient seen in office 05/21/12 for sinus infection and placed on ceftin x 10 days.  States still not improved, and now has developed possible yeast infection as well.  Per protocols, advised being seen within 24 hours; patient declines appt, as she states Dr. Dayton Martes told her to call back if not improved.  Wants Rx called in.  Info to office for staff/provider review/Rx/callback.  May reach patient at [phone] extension 3.  krs/can  Symptoms  Reason For Call & Symptoms: sinus pain and vaginal irritation  Reviewed Health History In EMR: Yes  Reviewed Medications In EMR: Yes  Reviewed Allergies In EMR: Yes  Reviewed Surgeries / Procedures: Yes  Date of Onset of Symptoms: Unknown OB / GYN:  LMP: Unknown  Guideline(s) Used:  Sinus Pain and Congestion  Vaginal Discharge  Vulvar Symptoms  Disposition Per Guideline:   See Today or Tomorrow in Office  Reason For Disposition Reached:   Vulvar itching and not improved > 3 days following Care Advice  Advice Given:  N/A  Patient Refused Recommendation:  Patient Requests Prescription  requests Rx

## 2012-06-18 ENCOUNTER — Encounter: Payer: Self-pay | Admitting: Family Medicine

## 2012-06-18 ENCOUNTER — Ambulatory Visit (INDEPENDENT_AMBULATORY_CARE_PROVIDER_SITE_OTHER): Payer: No Typology Code available for payment source | Admitting: Family Medicine

## 2012-06-18 ENCOUNTER — Encounter: Payer: Self-pay | Admitting: Radiology

## 2012-06-18 VITALS — BP 120/80 | HR 84 | Temp 97.9°F | Wt 177.0 lb

## 2012-06-18 DIAGNOSIS — M62838 Other muscle spasm: Secondary | ICD-10-CM

## 2012-06-18 MED ORDER — TIZANIDINE HCL 2 MG PO TABS: 2.0000 mg | ORAL_TABLET | Freq: Four times a day (QID) | ORAL | Status: DC | PRN

## 2012-06-18 NOTE — Progress Notes (Signed)
Subjective:    Patient ID: Jasmine Nolan, female    DOB: October 07, 1970, 42 y.o.   MRN: [id number]  HPI  42 yo very pleasant female here for:  Originally had swollen lymph nodes on left side of neck and chin last week.  They were painful but that has resolved. Now she has tightness in back of her neck and shoulder.  Some "tingling" in neck but not in her left arm.  H/o neck surgeries and not having any radicular symptoms that she has had in past.  No known injury.   Flexeril only helping a little.  Patient Active Problem List   Diagnosis Date Noted  . Trapezius muscle spasm 06/18/2012  . Chest pain, atypical 04/25/2012  . GERD (gastroesophageal reflux disease) 04/25/2012  . Back pain 11/23/2011  . Panic attacks 10/11/2011  . Fatigue 07/26/2011  . Cutaneous skin tags 06/01/2011  . Heart palpitations 01/26/2011  . Mitral valve prolapse 01/26/2011  . Inguinal mass 12/26/2010  . Lateral epicondylitis  of elbow 10/27/2010  . Exposure to STD 10/13/2010  . Joint pain 06/06/2010  . Obesity 06/06/2010  . Arthritis   . Depression    Past Medical History  Diagnosis Date  . Depression   . MVP (mitral valve prolapse)   . Arthritis   . Obesity   . Sinusitis   . Lateral epicondylitis of elbow   . Inguinal mass    Past Surgical History  Procedure Laterality Date  . Cesarean section  2001  . Lumbar disc surgery  June 2011  . Tubal ligation    . Endometrial ablation     History  Substance Use Topics  . Smoking status: Former Games developer  . Smokeless tobacco: Never Used  . Alcohol Use: Yes     Comment: rarely   Family History  Problem Relation Age of Onset  . Colon cancer Father 64    died of colon CA  . Depression Mother   . Arthritis Mother    Allergies  Allergen Reactions  . Bactrim Nausea And Vomiting  . Dicyclomine Hcl Rash  . Tetanus Toxoids Other (See Comments)    fever   Current Outpatient Prescriptions on File Prior to Visit  Medication Sig Dispense Refill  .  ALPRAZolam (XANAX) 0.5 MG tablet Take 1 tablet (0.5 mg total) by mouth at bedtime as needed.  30 tablet  0  . sertraline (ZOLOFT) 100 MG tablet Take 100 mg by mouth 2 (two) times daily.         No current facility-administered medications on file prior to visit.   The PMH, PSH, Social History, Family History, Medications, and allergies have been reviewed in [facility], and have been updated if relevant.   Review of Systems    See HPI No fever No tooth pain No n/v/d Objective:   Physical Exam  Constitutional: She appears well-developed and well-nourished. No distress.  HENT:  Head: Normocephalic.  Mouth/Throat: Oropharynx is clear and moist and mucous membranes are normal. Abnormal dentition. No dental caries.  Musculoskeletal:       Back:  Lymphadenopathy:       Head (left side): No submental, no submandibular, no tonsillar, no posterior auricular and no occipital adenopathy present.    She has no cervical adenopathy.   BP 120/80  Pulse 84  Temp(Src) 97.9 F (36.6 C)  Wt 177 lb (80.287 kg)  BMI 30.37 kg/m2        Assessment & Plan:  1. Trapezius muscle spasm New. D/c flexeril,  start zanaflex. Advised massage, heat. Call or return to clinic prn if these symptoms worsen or fail to improve as anticipated. The patient indicates understanding of these issues and agrees with the plan.

## 2012-06-18 NOTE — Patient Instructions (Addendum)
Good to see you. I think you have a trapezius spasm.  Your lymph nodes feel good. Get that massage, take zanaflex as directed. Call me in a few days.

## 2012-06-25 ENCOUNTER — Ambulatory Visit (INDEPENDENT_AMBULATORY_CARE_PROVIDER_SITE_OTHER): Payer: No Typology Code available for payment source | Admitting: Family Medicine

## 2012-06-25 ENCOUNTER — Encounter: Payer: Self-pay | Admitting: Family Medicine

## 2012-06-25 ENCOUNTER — Ambulatory Visit (INDEPENDENT_AMBULATORY_CARE_PROVIDER_SITE_OTHER)
Admission: RE | Admit: 2012-06-25 | Discharge: 2012-06-25 | Disposition: A | Discharge status: Home / Self Care | Payer: No Typology Code available for payment source | Source: Ambulatory Visit | Attending: Family Medicine | Admitting: Family Medicine

## 2012-06-25 VITALS — BP 130/88 | HR 71 | Temp 98.6°F | Ht 64.0 in | Wt 178.0 lb

## 2012-06-25 DIAGNOSIS — Z981 Arthrodesis status: Secondary | ICD-10-CM

## 2012-06-25 DIAGNOSIS — M542 Cervicalgia: Secondary | ICD-10-CM

## 2012-06-25 MED ORDER — PREDNISONE 20 MG PO TABS: ORAL_TABLET | ORAL | Status: DC

## 2012-06-25 NOTE — Telephone Encounter (Signed)
Spoke with patient, appt scheduled with Dr Patsy Lager for this afternoon.

## 2012-06-25 NOTE — Progress Notes (Signed)
Nature conservation officer at [facility] [address] [place] [address] Phone: [phone] Fax: [phone]  Date:  06/25/2012   Name:  Jasmine Nolan   DOB:  03/24/1970   MRN:  [id number] Gender: female Age: 42 y.o.  Primary Physician:  Ruthe Mannan, MD  Evaluating MD: Hannah Beat, MD   Chief Complaint: Neck Pain   History of Present Illness:  Jasmine Nolan is a 42 y.o. pleasant patient who presents with the following:  Has a catch and or kink in her neck. Has pain mostly on the left side of her neck.  A plate and 6 screws. Dr. Wynetta Emery did neck operation.  2 level fusion.  Had left sided radiculopathy, did PT, had 1 ESI, and had massive doses of prednisone.  About 2011  Not much rotation to the left. No numbness, tingling, weakness. S/p multiple nsaids Had massage x 1 and tried some muscle relaxants.   Patient Active Problem List   Diagnosis Date Noted  . Trapezius muscle spasm 06/18/2012  . Chest pain, atypical 04/25/2012  . GERD (gastroesophageal reflux disease) 04/25/2012  . Back pain 11/23/2011  . Panic attacks 10/11/2011  . Fatigue 07/26/2011  . Cutaneous skin tags 06/01/2011  . Heart palpitations 01/26/2011  . Mitral valve prolapse 01/26/2011  . Inguinal mass 12/26/2010  . Lateral epicondylitis  of elbow 10/27/2010  . Exposure to STD 10/13/2010  . Joint pain 06/06/2010  . Obesity 06/06/2010  . Arthritis   . Depression     Past Medical History  Diagnosis Date  . Depression   . MVP (mitral valve prolapse)   . Arthritis   . Obesity   . Sinusitis   . Lateral epicondylitis of elbow   . Inguinal mass     Past Surgical History  Procedure Laterality Date  . Cesarean section  2001  . Lumbar disc surgery  June 2011  . Tubal ligation    . Endometrial ablation      History   Social History  . Marital Status: Divorced    Spouse Name: N/A    Number of Children: 1  . Years of Education: N/A   Occupational History  . medical billing    Social  History Main Topics  . Smoking status: Former Games developer  . Smokeless tobacco: Never Used  . Alcohol Use: Yes     Comment: rarely  . Drug Use: No  . Sexually Active: Not on file   Other Topics Concern  . Not on file   Social History Narrative  . No narrative on file    Family History  Problem Relation Age of Onset  . Colon cancer Father 58    died of colon CA  . Depression Mother   . Arthritis Mother     Allergies  Allergen Reactions  . Bactrim Nausea And Vomiting  . Dicyclomine Hcl Rash  . Tetanus Toxoids Other (See Comments)    fever    Medication list has been reviewed and updated.  Outpatient Prescriptions Prior to Visit  Medication Sig Dispense Refill  . ALPRAZolam (XANAX) 0.5 MG tablet Take 1 tablet (0.5 mg total) by mouth at bedtime as needed.  30 tablet  0  . sertraline (ZOLOFT) 100 MG tablet Take 100 mg by mouth 2 (two) times daily.        Marland Kitchen tiZANidine (ZANAFLEX) 2 MG tablet Take 1 tablet (2 mg total) by mouth every 6 (six) hours as needed.  30 tablet  0  No facility-administered medications prior to visit.    Review of Systems:   GEN: No fevers, chills. Nontoxic. Primarily MSK c/o today. MSK: Detailed in the HPI GI: tolerating PO intake without difficulty Neuro: No numbness, parasthesias, or tingling associated. Otherwise the pertinent positives of the ROS are noted above.    Physical Examination: BP 130/88  Pulse 71  Temp(Src) 98.6 F (37 C) (Oral)  Ht 5\' 4"  (1.626 m)  Wt 178 lb (80.74 kg)  BMI 30.54 kg/m2  SpO2 96%  Ideal Body Weight: Weight in (lb) to have BMI = 25: 145.3   GEN: Well-developed,well-nourished,in no acute distress; alert,appropriate and cooperative throughout examination HEENT: Normocephalic and atraumatic without obvious abnormalities. Ears, externally no deformities PULM: Breathing comfortably in no respiratory distress EXT: No clubbing, cyanosis, or edema PSYCH: Normally interactive. Cooperative during the interview.  Pleasant. Friendly and conversant. Not anxious or depressed appearing. Normal, full affect.  CERVICAL SPINE EXAM Range of motion: Flexion, extension, lateral bending, and rotation: flex and ext full, mild limitation on lateral bending, 40% loss of lateral rotation to the left.  Pain with terminal motion: yes Spinous Processes: NT SCM: NT Upper paracervical muscles: tender on the l Upper traps: minimally tender C5-T1 intact, sensation and motor   Assessment and Plan:  Neck pain on left side - Plan: DG Cervical Spine Complete  History of fusion of cervical spine - Plan: DG Cervical Spine Complete  Xray to check hardware / fusion Steroid burst and taper ROM reviewed Cont muscle relaxant  If not improving in 2-3 weeks, PT initiation vs. Dr. Wynetta Emery opinion  Orders Today:  Orders Placed This Encounter  Procedures  . DG Cervical Spine Complete    Standing Status: Future     Number of Occurrences: 1     Standing Expiration Date: 08/25/2013    Order Specific Question:  Preferred imaging location?    Answer:  [facility]    Order Specific Question:  Reason for exam:    Answer:  neck pain, s/p 2 level cervical fusion    Updated Medication List: (Includes new medications, updates to list, dose adjustments) Meds ordered this encounter  Medications  . predniSONE (DELTASONE) 20 MG tablet    Sig: 2 tabs po daily for 1 week, then 1 po daily for 1 week    Dispense:  21 tablet    Refill:  0    Medications Discontinued: There are no discontinued medications.    Signed, Elpidio Galea. Dayten Juba, MD 06/25/2012 4:09 PM

## 2012-06-25 NOTE — Telephone Encounter (Signed)
Non-urgent. 1 week history. I'm not surprised not better. Can await pcp input.

## 2012-07-15 ENCOUNTER — Encounter: Payer: Self-pay | Admitting: Family Medicine

## 2012-07-16 NOTE — Telephone Encounter (Signed)
See notes above.   Why don't I recheck her next week?  (not Monday)

## 2012-07-22 NOTE — Telephone Encounter (Signed)
See my note below.   Hannah Beat, MD 07/22/2012, 10:28 AM

## 2012-07-23 ENCOUNTER — Encounter: Payer: Self-pay | Admitting: *Deleted

## 2012-07-24 ENCOUNTER — Encounter: Payer: Self-pay | Admitting: Family Medicine

## 2012-07-24 ENCOUNTER — Ambulatory Visit (INDEPENDENT_AMBULATORY_CARE_PROVIDER_SITE_OTHER): Payer: No Typology Code available for payment source | Admitting: Family Medicine

## 2012-07-24 VITALS — BP 120/74 | HR 102 | Temp 98.8°F | Ht 64.0 in | Wt 179.5 lb

## 2012-07-24 DIAGNOSIS — M542 Cervicalgia: Secondary | ICD-10-CM

## 2012-07-24 MED ORDER — METAXALONE 800 MG PO TABS: 800.0000 mg | ORAL_TABLET | Freq: Two times a day (BID) | ORAL | Status: DC

## 2012-07-24 MED ORDER — HYDROCODONE-ACETAMINOPHEN 5-325 MG PO TABS: 1.0000 | ORAL_TABLET | Freq: Four times a day (QID) | ORAL | Status: DC | PRN

## 2012-07-24 MED ORDER — DIAZEPAM 5 MG PO TABS: 5.0000 mg | ORAL_TABLET | Freq: Every evening | ORAL | Status: DC | PRN

## 2012-07-24 NOTE — Progress Notes (Signed)
Nature conservation officer at [facility] [address] [place] [address] Phone: [phone] Fax: [phone]  Date:  07/24/2012   Name:  Jasmine Nolan   DOB:  06-23-1970   MRN:  [id number] Gender: female Age: 42 y.o.  Primary Physician:  Ruthe Mannan, MD  Evaluating MD: Hannah Beat, MD   Chief Complaint: Neck Pain   History of Present Illness:  Jasmine Nolan is a 42 y.o. pleasant patient who presents with the following:  Pain and tingling. Muscle tightened  3-4 days better of pred taper only.  No numbness, tingling, but down the side of the neck.   Pleasant patient who I remember from our examination one month ago who had a previous history of a two-level fusion, anterior, done by Dr. Wynetta Emery 3 years ago, and now she presents with significant neck pain, more on the left side and she is not significantly improved compared to our prior examination one month ago. She did do along prednisone pulse and taper. She did not improve except for approximately 3 days. She denies any numbness, tingling, or weakness. She does have some restriction in her movement particularly with looking to the left. She has been doing some massage approximately once every 2 weeks.  Patient Active Problem List   Diagnosis Date Noted  . Trapezius muscle spasm 06/18/2012  . Chest pain, atypical 04/25/2012  . GERD (gastroesophageal reflux disease) 04/25/2012  . Back pain 11/23/2011  . Panic attacks 10/11/2011  . Fatigue 07/26/2011  . Cutaneous skin tags 06/01/2011  . Heart palpitations 01/26/2011  . Mitral valve prolapse 01/26/2011  . Inguinal mass 12/26/2010  . Lateral epicondylitis  of elbow 10/27/2010  . Exposure to STD 10/13/2010  . Joint pain 06/06/2010  . Obesity 06/06/2010  . Arthritis   . Depression     Past Medical History  Diagnosis Date  . Depression   . MVP (mitral valve prolapse)   . Arthritis   . Obesity   . Sinusitis   . Lateral epicondylitis of elbow   . Inguinal mass      Past Surgical History  Procedure Laterality Date  . Cesarean section  2001  . Lumbar disc surgery  June 2011  . Tubal ligation    . Endometrial ablation    . Cervical fusion  2011    Cram, 2 level    History   Social History  . Marital Status: Divorced    Spouse Name: N/A    Number of Children: 1  . Years of Education: N/A   Occupational History  . medical billing    Social History Main Topics  . Smoking status: Former Games developer  . Smokeless tobacco: Never Used  . Alcohol Use: Yes     Comment: rarely  . Drug Use: No  . Sexually Active: Not on file   Other Topics Concern  . Not on file   Social History Narrative  . No narrative on file    Family History  Problem Relation Age of Onset  . Colon cancer Father 58    died of colon CA  . Depression Mother   . Arthritis Mother     Allergies  Allergen Reactions  . Bactrim Nausea And Vomiting  . Dicyclomine Hcl Rash  . Tetanus Toxoids Other (See Comments)    fever    Medication list has been reviewed and updated.  Outpatient Prescriptions Prior to Visit  Medication Sig Dispense Refill  . ALPRAZolam (XANAX) 0.5 MG tablet Take 1  tablet (0.5 mg total) by mouth at bedtime as needed.  30 tablet  0  . sertraline (ZOLOFT) 100 MG tablet Take 100 mg by mouth 2 (two) times daily.        Marland Kitchen tiZANidine (ZANAFLEX) 2 MG tablet Take 1 tablet (2 mg total) by mouth every 6 (six) hours as needed.  30 tablet  0  . predniSONE (DELTASONE) 20 MG tablet 2 tabs po daily for 1 week, then 1 po daily for 1 week  21 tablet  0   No facility-administered medications prior to visit.    Review of Systems:   GEN: No fevers, chills. Nontoxic. Primarily MSK c/o today. MSK: Detailed in the HPI GI: tolerating PO intake without difficulty Neuro: No numbness, parasthesias, or tingling associated. Otherwise the pertinent positives of the ROS are noted above.    Physical Examination: BP 120/74  Pulse 102  Temp(Src) 98.8 F (37.1 C) (Oral)   Ht 5\' 4"  (1.626 m)  Wt 179 lb 8 oz (81.421 kg)  BMI 30.8 kg/m2  SpO2 96%  Ideal Body Weight: Weight in (lb) to have BMI = 25: 145.3   GEN: Well-developed,well-nourished,in no acute distress; alert,appropriate and cooperative throughout examination HEENT: Normocephalic and atraumatic without obvious abnormalities. Ears, externally no deformities PULM: Breathing comfortably in no respiratory distress EXT: No clubbing, cyanosis, or edema PSYCH: Normally interactive. Cooperative during the interview. Pleasant. Friendly and conversant. Not anxious or depressed appearing. Normal, full affect.  CERVICAL SPINE EXAM Range of motion: Flexion, extension, lateral bending, and rotation: Overall, proximal mid 20% loss of motion. There is approximately a 40 loss of motion with rotational movement to the left. Pain with terminal motion: yes Spinous Processes: nt SCM: NT Upper paracervical muscles: ttp, more on L Upper traps: ttp C5-T1 intact, sensation and motor  Dg Cervical Spine Complete  06/25/2012   *RADIOLOGY REPORT*  Clinical Data: Neck pain, prior cervical spine fusion  CERVICAL SPINE - COMPLETE 4+ VIEW  Comparison: 09/03/2009  Findings: Prevertebral soft tissues normal thickness. Prior anterior fusion of C5-C7 with incorporated disc prostheses at both levels. Hardware appears intact. Slight reversal of cervical lordosis at the upper cervical spine question muscle spasm. Vertebral body and remaining disc space heights maintained. No acute fracture, subluxation or bone destruction. Left foramina incompletely profiled. Lung apices clear. Odontoid process slightly approximates the right lateral mass of C1 though the head is rotated.  IMPRESSION: Prior anterior fusion C5-C7. Question muscle spasm. No acute osseous abnormalities.   Original Report Authenticated By: Ulyses Southward, M.D.    Assessment and Plan:  Neck pain on left side - Plan: Ambulatory referral to Physical Therapy  We discussed  potential conservative versus aggressive approaches. She does not appear to have any neurological compromise. The patient would like to proceed conservatively. We are going to initiate cervical spine rehabilitation. Continue with massage as needed. Continue NSAIDs. Change to Valium at nighttime. Skelaxin twice a day during the day. Also gave her some Vicodin in case she needed it for breakthrough pain.  Follow up in 4-6 weeks.  Orders Today:  Orders Placed This Encounter  Procedures  . Ambulatory referral to Physical Therapy    Referral Priority:  Routine    Referral Type:  Physical Medicine    Referral Reason:  Specialty Services Required    Requested Specialty:  Physical Therapy    Number of Visits Requested:  1    Updated Medication List: (Includes new medications, updates to list, dose adjustments) Meds ordered this encounter  Medications  .  diazepam (VALIUM) 5 MG tablet    Sig: Take 1 tablet (5 mg total) by mouth at bedtime as needed (muscle spasm).    Dispense:  30 tablet    Refill:  1  . metaxalone (SKELAXIN) 800 MG tablet    Sig: Take 1 tablet (800 mg total) by mouth 2 (two) times daily. During the daytime    Dispense:  60 tablet    Refill:  1  . HYDROcodone-acetaminophen (NORCO/VICODIN) 5-325 MG per tablet    Sig: Take 1 tablet by mouth every 6 (six) hours as needed for pain.    Dispense:  40 tablet    Refill:  0    Medications Discontinued: Medications Discontinued During This Encounter  Medication Reason  . predniSONE (DELTASONE) 20 MG tablet Error  . tiZANidine (ZANAFLEX) 2 MG tablet       Signed, Deania Siguenza T. Mafalda Mcginniss, MD 07/24/2012 4:24 PM

## 2012-07-24 NOTE — Patient Instructions (Addendum)
F/u 4-6 weeks  REFERRAL: GO THE THE FRONT ROOM AT THE ENTRANCE OF OUR CLINIC, NEAR CHECK IN. ASK FOR MARION. SHE WILL HELP YOU SET UP YOUR REFERRAL. DATE: TIME:

## 2012-08-07 ENCOUNTER — Encounter: Payer: Self-pay | Admitting: Family Medicine

## 2012-08-07 ENCOUNTER — Ambulatory Visit (INDEPENDENT_AMBULATORY_CARE_PROVIDER_SITE_OTHER): Payer: No Typology Code available for payment source | Admitting: Family Medicine

## 2012-08-07 ENCOUNTER — Encounter: Payer: Self-pay | Admitting: *Deleted

## 2012-08-07 VITALS — BP 120/72 | HR 99 | Temp 98.4°F | Ht 64.0 in | Wt 180.5 lb

## 2012-08-07 DIAGNOSIS — B349 Viral infection, unspecified: Secondary | ICD-10-CM

## 2012-08-07 DIAGNOSIS — J209 Acute bronchitis, unspecified: Secondary | ICD-10-CM

## 2012-08-07 DIAGNOSIS — B9789 Other viral agents as the cause of diseases classified elsewhere: Secondary | ICD-10-CM

## 2012-08-07 MED ORDER — AZITHROMYCIN 250 MG PO TABS: ORAL_TABLET | ORAL | Status: DC

## 2012-08-07 NOTE — Progress Notes (Signed)
Patient Name: Jasmine Nolan Date of Birth: Feb 23, 1970 Medical Record Number: [id number]  History of Present Illness:  Patent presents with runny nose, sneezing, cough, sore throat, malaise and minimal / low-grade fever .  Coughing and sneezing. Nasal congestion. Chest is tight. Hurts.  Off and on smoker for years.   Feels like burning up ? recent exposure to others with similar symptoms.   The patent denies sore throat as the primary complaint. Denies sthortness of breath/wheezing, high fever, chest pain, rhinits for more than 14 days, significant myalgia, otalgia, facial pain, abdominal pain, changes in bowel or bladder.  PMH, PHS, Allergies, Problem List, Medications, Family History, and Social History have all been reviewed.  Patient Active Problem List   Diagnosis Date Noted  . Trapezius muscle spasm 06/18/2012  . Chest pain, atypical 04/25/2012  . GERD (gastroesophageal reflux disease) 04/25/2012  . Back pain 11/23/2011  . Panic attacks 10/11/2011  . Fatigue 07/26/2011  . Cutaneous skin tags 06/01/2011  . Heart palpitations 01/26/2011  . Mitral valve prolapse 01/26/2011  . Inguinal mass 12/26/2010  . Lateral epicondylitis  of elbow 10/27/2010  . Exposure to STD 10/13/2010  . Joint pain 06/06/2010  . Obesity 06/06/2010  . Arthritis   . Depression     Past Medical History  Diagnosis Date  . Depression   . MVP (mitral valve prolapse)   . Arthritis   . Obesity   . Sinusitis   . Lateral epicondylitis of elbow   . Inguinal mass     Past Surgical History  Procedure Laterality Date  . Cesarean section  2001  . Lumbar disc surgery  June 2011  . Tubal ligation    . Endometrial ablation    . Cervical fusion  2011    Cram, 2 level    History   Social History  . Marital Status: Divorced    Spouse Name: N/A    Number of Children: 1  . Years of Education: N/A   Occupational History  . medical billing    Social History Main Topics  . Smoking status: Former  Games developer  . Smokeless tobacco: Never Used  . Alcohol Use: Yes     Comment: rarely  . Drug Use: No  . Sexually Active: Not on file   Other Topics Concern  . Not on file   Social History Narrative  . No narrative on file    Family History  Problem Relation Age of Onset  . Colon cancer Father 69    died of colon CA  . Depression Mother   . Arthritis Mother     Allergies  Allergen Reactions  . Bactrim Nausea And Vomiting  . Dicyclomine Hcl Rash  . Tetanus Toxoids Other (See Comments)    fever    Current Outpatient Prescriptions on File Prior to Visit  Medication Sig Dispense Refill  . diazepam (VALIUM) 5 MG tablet Take 1 tablet (5 mg total) by mouth at bedtime as needed (muscle spasm).  30 tablet  1  . HYDROcodone-acetaminophen (NORCO/VICODIN) 5-325 MG per tablet Take 1 tablet by mouth every 6 (six) hours as needed for pain.  40 tablet  0  . metaxalone (SKELAXIN) 800 MG tablet Take 1 tablet (800 mg total) by mouth 2 (two) times daily. During the daytime  60 tablet  1  . sertraline (ZOLOFT) 100 MG tablet Take 100 mg by mouth 2 (two) times daily.        Marland Kitchen ALPRAZolam (XANAX) 0.5 MG tablet Take  1 tablet (0.5 mg total) by mouth at bedtime as needed.  30 tablet  0   No current facility-administered medications on file prior to visit.    Review of Systems: as above, eating and drinking - tolerating PO. Urinating normally. No excessive vomitting or diarrhea. O/w as above.  Physical Exam:  Filed Vitals:   08/07/12 0920  BP: 120/72  Pulse: 99  Temp: 98.4 F (36.9 C)  TempSrc: Oral  Height: 5\' 4"  (1.626 m)  Weight: 180 lb 8 oz (81.874 kg)  SpO2: 97%    GEN: WDWN, Non-toxic, Atraumatic, normocephalic. A and O x 3. HEENT: Oropharynx clear without exudate, MMM, no significant LAD, mild rhinnorhea Ears: TM clear, COL visualized with good landmarks CV: RRR, no m/g/r. Pulm: CTA B, no wheezes, rhonchi, or crackles, normal respiratory effort. EXT: no c/c/e Psych: well oriented,  neither depressed nor anxious in appearance  A/P: 1. URI. Supportive care reviewed with patient. See patient instruction section.   If worsens over weekend, ok to start abx

## 2012-10-03 ENCOUNTER — Ambulatory Visit (INDEPENDENT_AMBULATORY_CARE_PROVIDER_SITE_OTHER): Payer: No Typology Code available for payment source | Admitting: Family Medicine

## 2012-10-03 ENCOUNTER — Encounter: Payer: Self-pay | Admitting: Family Medicine

## 2012-10-03 VITALS — BP 120/80 | HR 76 | Temp 98.8°F | Ht 64.0 in | Wt 180.2 lb

## 2012-10-03 DIAGNOSIS — M109 Gout, unspecified: Secondary | ICD-10-CM

## 2012-10-03 MED ORDER — COLCHICINE 0.6 MG PO TABS: 0.6000 mg | ORAL_TABLET | Freq: Two times a day (BID) | ORAL | Status: DC

## 2012-10-03 MED ORDER — INDOMETHACIN 50 MG PO CAPS: 50.0000 mg | ORAL_CAPSULE | Freq: Three times a day (TID) | ORAL | Status: DC

## 2012-10-03 NOTE — Progress Notes (Signed)
Nature conservation officer at [facility] [address] [place] [address] Phone: [phone] Fax: [phone]  Date:  10/03/2012   Name:  Jasmine Nolan   DOB:  09-26-1970   MRN:  [id number] Gender: female Age: 42 y.o.  Primary Physician:  Ruthe Mannan, MD  Evaluating MD: Hannah Beat, MD   Chief Complaint: pain in finger   History of Present Illness:  Jasmine Nolan is a 42 y.o. pleasant patient who presents with the following:  R pip joint of the index finger without trauma. Never had gout before or CPPD. Father had a history of gout later in life. Swelling in the knuckle, warmth, pain with pressing and with motion.   Patient Active Problem List   Diagnosis Date Noted  . Trapezius muscle spasm 06/18/2012  . Chest pain, atypical 04/25/2012  . GERD (gastroesophageal reflux disease) 04/25/2012  . Back pain 11/23/2011  . Panic attacks 10/11/2011  . Fatigue 07/26/2011  . Cutaneous skin tags 06/01/2011  . Heart palpitations 01/26/2011  . Mitral valve prolapse 01/26/2011  . Inguinal mass 12/26/2010  . Lateral epicondylitis  of elbow 10/27/2010  . Exposure to STD 10/13/2010  . Joint pain 06/06/2010  . Obesity 06/06/2010  . Arthritis   . Depression     Past Medical History  Diagnosis Date  . Depression   . MVP (mitral valve prolapse)   . Arthritis   . Obesity   . Sinusitis   . Lateral epicondylitis of elbow   . Inguinal mass     Past Surgical History  Procedure Laterality Date  . Cesarean section  2001  . Lumbar disc surgery  June 2011  . Tubal ligation    . Endometrial ablation    . Cervical fusion  2011    Cram, 2 level    History   Social History  . Marital Status: Divorced    Spouse Name: N/A    Number of Children: 1  . Years of Education: N/A   Occupational History  . medical billing    Social History Main Topics  . Smoking status: Former Games developer  . Smokeless tobacco: Never Used  . Alcohol Use: Yes     Comment: rarely  . Drug Use: No  .  Sexual Activity: Not on file   Other Topics Concern  . Not on file   Social History Narrative  . No narrative on file    Family History  Problem Relation Age of Onset  . Colon cancer Father 62    died of colon CA  . Depression Mother   . Arthritis Mother     Allergies  Allergen Reactions  . Bactrim Nausea And Vomiting  . Dicyclomine Hcl Rash  . Tetanus Toxoids Other (See Comments)    fever    Medication list has been reviewed and updated.  Outpatient Prescriptions Prior to Visit  Medication Sig Dispense Refill  . ALPRAZolam (XANAX) 0.5 MG tablet Take 1 tablet (0.5 mg total) by mouth at bedtime as needed.  30 tablet  0  . diazepam (VALIUM) 5 MG tablet Take 1 tablet (5 mg total) by mouth at bedtime as needed (muscle spasm).  30 tablet  1  . HYDROcodone-acetaminophen (NORCO/VICODIN) 5-325 MG per tablet Take 1 tablet by mouth every 6 (six) hours as needed for pain.  40 tablet  0  . sertraline (ZOLOFT) 100 MG tablet Take 200 mg by mouth daily.       . metaxalone (SKELAXIN) 800 MG tablet Take  1 tablet (800 mg total) by mouth 2 (two) times daily. During the daytime  60 tablet  1  . azithromycin (ZITHROMAX Z-PAK) 250 MG tablet Take 2 tablets (500 mg) on  Day 1,  followed by 1 tablet (250 mg) once daily on Days 2 through 5.  6 each  0   No facility-administered medications prior to visit.    Review of Systems:   GEN: No fevers, chills. Nontoxic. Primarily MSK c/o today. MSK: Detailed in the HPI GI: tolerating PO intake without difficulty Neuro: No numbness, parasthesias, or tingling associated. Otherwise the pertinent positives of the ROS are noted above.    Physical Examination: BP 120/80  Pulse 76  Temp(Src) 98.8 F (37.1 C) (Oral)  Ht 5\' 4"  (1.626 m)  Wt 180 lb 4 oz (81.761 kg)  BMI 30.92 kg/m2  Ideal Body Weight: Weight in (lb) to have BMI = 25: 145.3   GEN: WDWN, NAD, Non-toxic, Alert & Oriented x 3 HEENT: Atraumatic, Normocephalic.  Ears and Nose: No  external deformity. EXTR: No clubbing/cyanosis/edema NEURO: Normal gait.  PSYCH: Normally interactive. Conversant. Not depressed or anxious appearing.  Calm demeanor.   r hand Ecchymosis or edema: r pip with some swelling at 2nd ROM wrist/hand/digits: limitation at pip Distal Ulna and Radius: NT Ecchymosis or edema: neg No instability Cysts/nodules: neg Digit triggering: neg Finkelstein's test: neg Snuffbox tenderness: neg Scaphoid tubercle: NT Resisted supination: NT Full composite fist, no malrotation Grip, all digits: 5/5 str DIPJT: NT PIP JT: as above, some warmth MCP JT: NT Atrophy: neg  Hand sensation: intact   Assessment and Plan:  Gout  Most likely gout vs cppd. I don't think i could aspirate the pip joint, non-crystal proven. Indocin first, then if not improving start colchicine.  Orders Today:  No orders of the defined types were placed in this encounter.    Updated Medication List: (Includes new medications, updates to list, dose adjustments) Meds ordered this encounter  Medications  . metaxalone (SKELAXIN) 800 MG tablet    Sig: Take 800 mg by mouth 2 (two) times daily as needed. During the daytime  . indomethacin (INDOCIN) 50 MG capsule    Sig: Take 1 capsule (50 mg total) by mouth 3 (three) times daily with meals.    Dispense:  40 capsule    Refill:  3  . colchicine 0.6 MG tablet    Sig: Take 1 tablet (0.6 mg total) by mouth 2 (two) times daily.    Dispense:  60 tablet    Refill:  2    Medications Discontinued: Medications Discontinued During This Encounter  Medication Reason  . azithromycin (ZITHROMAX Z-PAK) 250 MG tablet Completed Course  . metaxalone (SKELAXIN) 800 MG tablet       Signed, Airiana Elman T. Moxon Messler, MD 10/03/2012 6:50 PM

## 2012-10-04 DIAGNOSIS — M109 Gout, unspecified: Secondary | ICD-10-CM | POA: Insufficient documentation

## 2012-11-21 ENCOUNTER — Other Ambulatory Visit: Payer: Self-pay

## 2012-12-27 ENCOUNTER — Encounter: Payer: Self-pay | Admitting: Internal Medicine

## 2013-01-31 ENCOUNTER — Telehealth: Payer: Self-pay | Admitting: Internal Medicine

## 2013-01-31 ENCOUNTER — Ambulatory Visit: Payer: No Typology Code available for payment source | Admitting: Internal Medicine

## 2013-02-04 ENCOUNTER — Encounter: Payer: Self-pay | Admitting: Family Medicine

## 2013-02-05 ENCOUNTER — Encounter: Payer: No Typology Code available for payment source | Admitting: Internal Medicine

## 2013-02-07 ENCOUNTER — Ambulatory Visit: Payer: No Typology Code available for payment source | Admitting: Family Medicine

## 2013-02-11 ENCOUNTER — Encounter: Payer: Self-pay | Admitting: Family Medicine

## 2013-02-26 ENCOUNTER — Encounter: Payer: Self-pay | Admitting: Internal Medicine

## 2013-02-26 ENCOUNTER — Ambulatory Visit (INDEPENDENT_AMBULATORY_CARE_PROVIDER_SITE_OTHER): Payer: No Typology Code available for payment source | Admitting: Internal Medicine

## 2013-02-26 VITALS — BP 122/70 | HR 100 | Ht 64.0 in | Wt 177.0 lb

## 2013-02-26 DIAGNOSIS — R932 Abnormal findings on diagnostic imaging of liver and biliary tract: Secondary | ICD-10-CM

## 2013-02-26 DIAGNOSIS — K219 Gastro-esophageal reflux disease without esophagitis: Secondary | ICD-10-CM

## 2013-02-26 DIAGNOSIS — R1013 Epigastric pain: Secondary | ICD-10-CM

## 2013-02-26 DIAGNOSIS — K7689 Other specified diseases of liver: Secondary | ICD-10-CM

## 2013-02-26 DIAGNOSIS — K760 Fatty (change of) liver, not elsewhere classified: Secondary | ICD-10-CM

## 2013-02-26 MED ORDER — OMEPRAZOLE 40 MG PO CPDR: 40.0000 mg | DELAYED_RELEASE_CAPSULE | Freq: Every day | ORAL | Status: DC

## 2013-02-26 NOTE — Progress Notes (Signed)
HISTORY OF PRESENT ILLNESS:  Jasmine Nolan is a 43 y.o. female with the below listed medical history who is referred today by her primary care provider regarding problems with abdominal pain and fatty liver on imaging. The patient was last seen 03/08/2011 when she underwent screening colonoscopy for family history of colon cancer in her father (metastatic disease in his 23s). The examination was normal. Internal hemorrhoids noted. She presents today with a six-month history of intermittent problems with epigastric pain with radiation into the back. The discomfort is described as dull and occurs 2-3 times per week generally lasting several hours. The discomfort can awaken her. She does not notice any affect from food or activity. She also mentions problems with regurgitation at night when rolling over. Significant pyrosis as well. To take a short course of Nexium last year, which helps. No dysphagia. Her weight has been stable. Bowel habits unchanged from baseline. Review of blood work from April 2014 reveals normal liver tests. Review of CT scan from July 2013 was unremarkable except for fatty liver. Apparently had an abdominal ultrasound at [facility] which also revealed fatty liver. That report has been requested.  REVIEW OF SYSTEMS:  All non-GI ROS negative except for sinus and allergy, anxiety, fatigue, night sweats, insomnia  Past Medical History  Diagnosis Date  . Depression   . MVP (mitral valve prolapse)   . Arthritis   . Obesity   . Sinusitis   . Lateral epicondylitis of elbow   . Inguinal mass   . Internal hemorrhoids   . Gout   . IBS (irritable bowel syndrome)   . Pneumonia   . Anxiety     Past Surgical History  Procedure Laterality Date  . Cesarean section  2001  . Lumbar disc surgery  June 2011  . Tubal ligation    . Endometrial ablation    . Cervical fusion  2011    Cram, 2 level    Social History Jasmine Nolan  reports that she quit smoking about 5 years ago. She  has never used smokeless tobacco. She reports that she drinks alcohol. She reports that she does not use illicit drugs.  family history includes Arthritis in her mother; Colon cancer (age of onset: 3) in her father; Depression in her mother.  Allergies  Allergen Reactions  . Bactrim Nausea And Vomiting  . Dicyclomine Hcl Rash  . Tetanus Toxoids Other (See Comments)    fever       PHYSICAL EXAMINATION: Vital signs: BP 122/70  Pulse 100  Ht 5\' 4"  (1.626 m)  Wt 177 lb (80.287 kg)  BMI 30.37 kg/m2  Constitutional: generally well-appearing, no acute distress Psychiatric: alert and oriented x3, cooperative Eyes: extraocular movements intact, anicteric, conjunctiva pink Mouth: oral pharynx moist, no lesions Neck: supple no lymphadenopathy Cardiovascular: heart regular rate and rhythm, no murmur Lungs: clear to auscultation bilaterally Abdomen: soft, obese, nontender, nondistended, no obvious ascites, no peritoneal signs, normal bowel sounds, no organomegaly Rectal: Omitted Extremities: no lower extremity edema bilaterally Skin: no lesions on visible extremities Neuro: No focal deficits.   ASSESSMENT:  #1. GERD. #2. Epigastric pain. May be related to GERD. Rule out peptic ulcer disease #3. Fatty liver  PLAN:  #1. Educational discussion on GERD #2. Literature on GERD as well as reflux precautions provided. Advised institute reflux precautions with attention to weight loss #3. Diagnostic upper endoscopy.The nature of the procedure, as well as the risks, benefits, and alternatives were carefully and thoroughly reviewed with the  patient. Ample time for discussion and questions allowed. The patient understood, was satisfied, and agreed to proceed. #4. Educational discussion on fatty liver disease #5. Exercise and weight loss as the mainstay of therapy for fatty liver disease #6. Prescribe omeprazole 40 mg daily

## 2013-02-26 NOTE — Patient Instructions (Signed)
We have sent the following medications to your pharmacy for you to pick up at your convenience: Omeprazole  You have been scheduled for an endoscopy with propofol. Please follow written instructions given to you at your visit today. If you use inhalers (even only as needed), please bring them with you on the day of your procedure. Your physician has requested that you go to www.startemmi.com and enter the access code given to you at your visit today. This web site gives a general overview about your procedure. However, you should still follow specific instructions given to you by our office regarding your preparation for the procedure.

## 2013-03-04 ENCOUNTER — Encounter: Payer: Self-pay | Admitting: Internal Medicine

## 2013-03-26 ENCOUNTER — Encounter: Payer: No Typology Code available for payment source | Admitting: Internal Medicine

## 2013-04-04 ENCOUNTER — Telehealth: Payer: Self-pay | Admitting: Internal Medicine

## 2013-04-07 ENCOUNTER — Encounter: Payer: No Typology Code available for payment source | Admitting: Internal Medicine

## 2013-05-16 NOTE — Telephone Encounter (Signed)
Pt rescheduled and came 02/05/2013

## 2013-05-19 NOTE — Telephone Encounter (Signed)
PT NOT BILLED EGD CX FEE

## 2013-07-14 ENCOUNTER — Telehealth: Payer: Self-pay | Admitting: Family Medicine

## 2013-07-14 NOTE — Telephone Encounter (Signed)
Pt thinks she has a UTI.  Your schedule is booked and so is everyone else for tomorrow.  Pt wants to know if there is anywhere you can squeeze her in or if you can call something in for her UTI.

## 2013-07-14 NOTE — Telephone Encounter (Signed)
Spoke to pt and advised per Dr Dayton MartesAron. Pt advised if not here by 0715, she will not be able to be seen as we are double-booking slot

## 2013-07-14 NOTE — Telephone Encounter (Signed)
I'm completely booked but if she comes in tomorrow at 7:15 we can double book.

## 2013-07-15 ENCOUNTER — Encounter: Payer: Self-pay | Admitting: Family Medicine

## 2013-07-15 ENCOUNTER — Ambulatory Visit (INDEPENDENT_AMBULATORY_CARE_PROVIDER_SITE_OTHER): Payer: No Typology Code available for payment source | Admitting: Family Medicine

## 2013-07-15 VITALS — BP 118/70 | HR 100 | Temp 97.9°F | Ht 63.75 in | Wt 180.5 lb

## 2013-07-15 DIAGNOSIS — R109 Unspecified abdominal pain: Secondary | ICD-10-CM

## 2013-07-15 DIAGNOSIS — N3000 Acute cystitis without hematuria: Secondary | ICD-10-CM

## 2013-07-15 MED ORDER — CIPROFLOXACIN HCL 500 MG PO TABS: 500.0000 mg | ORAL_TABLET | Freq: Two times a day (BID) | ORAL | Status: DC

## 2013-07-15 NOTE — Addendum Note (Signed)
Addended by: KNIGHT, WAYNETTA H on: 07/15/2013 08:06 AM   Modules accepted: Orders  

## 2013-07-15 NOTE — Progress Notes (Signed)
SUBJECTIVE: Jasmine Nolan is a 43 y.o. female who complains of urinary frequency, urgency and dysuria x 4 days, without flank pain, fever, chills, or abnormal vaginal discharge or bleeding.   Current Outpatient Prescriptions on File Prior to Visit  Medication Sig Dispense Refill  . ALPRAZolam (XANAX) 0.5 MG tablet Take 1 tablet (0.5 mg total) by mouth at bedtime as needed.  30 tablet  0  . diazepam (VALIUM) 5 MG tablet Take 1 tablet (5 mg total) by mouth at bedtime as needed (muscle spasm).  30 tablet  1  . indomethacin (INDOCIN) 50 MG capsule Take 1 capsule (50 mg total) by mouth 3 (three) times daily with meals.  40 capsule  3  . metaxalone (SKELAXIN) 800 MG tablet Take 800 mg by mouth 2 (two) times daily as needed. During the daytime      . omeprazole (PRILOSEC) 40 MG capsule Take 1 capsule (40 mg total) by mouth daily.  30 capsule  11  . sertraline (ZOLOFT) 100 MG tablet Take 150 mg by mouth daily.        No current facility-administered medications on file prior to visit.    Allergies  Allergen Reactions  . Bactrim Nausea And Vomiting  . Dicyclomine Hcl Rash  . Tetanus Toxoids Other (See Comments)    fever    Past Medical History  Diagnosis Date  . Depression   . MVP (mitral valve prolapse)   . Arthritis   . Obesity   . Sinusitis   . Lateral epicondylitis of elbow   . Inguinal mass   . Internal hemorrhoids   . Gout   . IBS (irritable bowel syndrome)   . Pneumonia   . Anxiety     Past Surgical History  Procedure Laterality Date  . Cesarean section  2001  . Lumbar disc surgery  June 2011  . Tubal ligation    . Endometrial ablation    . Cervical fusion  2011    Cram, 2 level    Family History  Problem Relation Age of Onset  . Colon cancer Father [id number]    died of colon CA  . Depression Mother   . Arthritis Mother     History   Social History  . Marital Status: Divorced    Spouse Name: N/A    Number of Children: 1  . Years of Education: N/A   Occupational  History  . medical billing    Social History Main Topics  . Smoking status: Former Smoker    Quit date: 01/17/2008  . Smokeless tobacco: Never Used  . Alcohol Use: Yes     Comment: rarely  . Drug Use: No  . Sexual Activity: Not on file   Other Topics Concern  . Not on file   Social History Narrative  . No narrative on file   The PMH, PSH, Social History, Family History, Medications, and allergies have [facility], and have been updated if relevant.   OBJECTIVE:  BP 118/70  Pulse 100  Temp(Src) 97.9 F (36.6 C) (Oral)  Ht 5' 3.75" (1.619 m)  Wt 180 lb 8 oz (81.874 kg)  BMI 31.24 kg/m2  SpO2 95%  Appears well, in no apparent distress.  Vital signs are normal. The abdomen is soft without tenderness, guarding, mass, rebound or organomegaly. No CVA tenderness or inguinal adenopathy noted. Urine dipstick shows not done- took AZO.    ASSESSMENT: UTI uncomplicated without evidence of pyelonephritis  PLAN: Treatment per orders - cipro  500 mg twice daily 5 days, sufla allergic, also push fluids, may use Pyridium OTC prn. Call or return to clinic prn if these symptoms worsen or fail to improve as anticipated.

## 2013-07-15 NOTE — Progress Notes (Signed)
Pre visit review using our clinic review tool, if applicable. No additional management support is needed unless otherwise documented below in the visit note. 

## 2013-07-15 NOTE — Patient Instructions (Signed)
Good to see you. We will call you with your urine culture results. Take cipro as directed- 1 tablet twice daily for 5 days.

## 2013-07-16 ENCOUNTER — Ambulatory Visit: Payer: No Typology Code available for payment source | Admitting: Family Medicine

## 2013-07-16 LAB — URINE CULTURE

## 2013-08-05 ENCOUNTER — Ambulatory Visit (INDEPENDENT_AMBULATORY_CARE_PROVIDER_SITE_OTHER): Payer: No Typology Code available for payment source | Admitting: Internal Medicine

## 2013-08-05 ENCOUNTER — Encounter: Payer: Self-pay | Admitting: Internal Medicine

## 2013-08-05 VITALS — BP 114/66 | HR 104 | Temp 100.0°F | Wt 176.0 lb

## 2013-08-05 DIAGNOSIS — R059 Cough, unspecified: Secondary | ICD-10-CM

## 2013-08-05 DIAGNOSIS — J069 Acute upper respiratory infection, unspecified: Secondary | ICD-10-CM

## 2013-08-05 DIAGNOSIS — R05 Cough: Secondary | ICD-10-CM

## 2013-08-05 MED ORDER — HYDROCODONE-HOMATROPINE 5-1.5 MG/5ML PO SYRP: 5.0000 mL | ORAL_SOLUTION | Freq: Three times a day (TID) | ORAL | Status: DC | PRN

## 2013-08-05 MED ORDER — AMOXICILLIN 875 MG PO TABS: 875.0000 mg | ORAL_TABLET | Freq: Two times a day (BID) | ORAL | Status: DC

## 2013-08-05 NOTE — Progress Notes (Signed)
Pre visit review using our clinic review tool, if applicable. No additional management support is needed unless otherwise documented below in the visit note. 

## 2013-08-05 NOTE — Patient Instructions (Addendum)
Upper Respiratory Infection, Adult An upper respiratory infection (URI) is also sometimes known as the common cold. The upper respiratory tract includes the nose, sinuses, throat, trachea, and bronchi. Bronchi are the airways leading to the lungs. Most people improve within 1 week, but symptoms can last up to 2 weeks. A residual cough may last even longer.  CAUSES Many different viruses can infect the tissues lining the upper respiratory tract. The tissues become irritated and inflamed and often become very moist. Mucus production is also common. A cold is contagious. You can easily spread the virus to others by oral contact. This includes kissing, sharing a glass, coughing, or sneezing. Touching your mouth or nose and then touching a surface, which is then touched by another person, can also spread the virus. SYMPTOMS  Symptoms typically develop 1 to 3 days after you come in contact with a cold virus. Symptoms vary from person to person. They may include:  Runny nose.  Sneezing.  Nasal congestion.  Sinus irritation.  Sore throat.  Loss of voice (laryngitis).  Cough.  Fatigue.  Muscle aches.  Loss of appetite.  Headache.  Low-grade fever. DIAGNOSIS  You might diagnose your own cold based on familiar symptoms, since most people get a cold 2 to 3 times a year. Your caregiver can confirm this based on your exam. Most importantly, your caregiver can check that your symptoms are not due to another disease such as strep throat, sinusitis, pneumonia, asthma, or epiglottitis. Blood tests, throat tests, and X-rays are not necessary to diagnose a common cold, but they may sometimes be helpful in excluding other more serious diseases. Your caregiver will decide if any further tests are required. RISKS AND COMPLICATIONS  You may be at risk for a more severe case of the common cold if you smoke cigarettes, have chronic heart disease (such as heart failure) or lung disease (such as asthma), or if  you have a weakened immune system. The very young and very old are also at risk for more serious infections. Bacterial sinusitis, middle ear infections, and bacterial pneumonia can complicate the common cold. The common cold can worsen asthma and chronic obstructive pulmonary disease (COPD). Sometimes, these complications can require emergency medical care and may be life-threatening. PREVENTION  The best way to protect against getting a cold is to practice good hygiene. Avoid oral or hand contact with people with cold symptoms. Wash your hands often if contact occurs. There is no clear evidence that vitamin C, vitamin E, echinacea, or exercise reduces the chance of developing a cold. However, it is always recommended to get plenty of rest and practice good nutrition. TREATMENT  Treatment is directed at relieving symptoms. There is no cure. Antibiotics are not effective, because the infection is caused by a virus, not by bacteria. Treatment may include:  Increased fluid intake. Sports drinks offer valuable electrolytes, sugars, and fluids.  Breathing heated mist or steam (vaporizer or shower).  Eating chicken soup or other clear broths, and maintaining good nutrition.  Getting plenty of rest.  Using gargles or lozenges for comfort.  Controlling fevers with ibuprofen or acetaminophen as directed by your caregiver.  Increasing usage of your inhaler if you have asthma. Zinc gel and zinc lozenges, taken in the first 24 hours of the common cold, can shorten the duration and lessen the severity of symptoms. Pain medicines may help with fever, muscle aches, and throat pain. A variety of non-prescription medicines are available to treat congestion and runny nose. Your caregiver   can make recommendations and may suggest nasal or lung inhalers for other symptoms.  HOME CARE INSTRUCTIONS   Only take over-the-counter or prescription medicines for pain, discomfort, or fever as directed by your  caregiver.  Use a warm mist humidifier or inhale steam from a shower to increase air moisture. This may keep secretions moist and make it easier to breathe.  Drink enough water and fluids to keep your urine clear or pale yellow.  Rest as needed.  Return to work when your temperature has returned to normal or as your caregiver advises. You may need to stay home longer to avoid infecting others. You can also use a face mask and careful hand washing to prevent spread of the virus. SEEK MEDICAL CARE IF:   After the first few days, you feel you are getting worse rather than better.  You need your caregiver's advice about medicines to control symptoms.  You develop chills, worsening shortness of breath, or brown or red sputum. These may be signs of pneumonia.  You develop yellow or brown nasal discharge or pain in the face, especially when you bend forward. These may be signs of sinusitis.  You develop a fever, swollen neck glands, pain with swallowing, or white areas in the back of your throat. These may be signs of strep throat. SEEK IMMEDIATE MEDICAL CARE IF:   You have a fever.  You develop severe or persistent headache, ear pain, sinus pain, or chest pain.  You develop wheezing, a prolonged cough, cough up blood, or have a change in your usual mucus (if you have chronic lung disease).  You develop sore muscles or a stiff neck. Document Released: 06/28/2000 Document Revised: 03/27/2011 Document Reviewed: 05/06/2010 Exit[employer] Patient Information 2015 [employer]. This information is not intended to replace advice given to you by your health care provider. Make sure you discuss any questions you have with your health care provider.  

## 2013-08-05 NOTE — Progress Notes (Signed)
HPI  Pt presents to the clinic today with c/o cough and congestion. She reports this started 4 days ago. The cough is non productive. She has had some associated shortness of breath and fatigue. She denies fever or chills. She has tried Mucinex and Iburprofen without relief. She reports that her symptoms are getting worse. She has no history of seasonal allergies or breathing problems. She has not had sick contacts that she is aware of.  Review of Systems      Past Medical History  Diagnosis Date  . Depression   . MVP (mitral valve prolapse)   . Arthritis   . Obesity   . Sinusitis   . Lateral epicondylitis of elbow   . Inguinal mass   . Internal hemorrhoids   . Gout   . IBS (irritable bowel syndrome)   . Pneumonia   . Anxiety     Family History  Problem Relation Age of Onset  . Colon cancer Father [id number]    died of colon CA  . Depression Mother   . Arthritis Mother     History   Social History  . Marital Status: Divorced    Spouse Name: N/A    Number of Children: 1  . Years of Education: N/A   Occupational History  . medical billing    Social History Main Topics  . Smoking status: Former Smoker    Quit date: 01/17/2008  . Smokeless tobacco: Never Used  . Alcohol Use: Yes     Comment: rarely  . Drug Use: No  . Sexual Activity: Not on file   Other Topics Concern  . Not on file   Social History Narrative  . No narrative on file    Allergies  Allergen Reactions  . Bactrim Nausea And Vomiting  . Dicyclomine Hcl Rash  . Tetanus Toxoids Other (See Comments)    fever     Constitutional: Positive headache, fatigue She does report a fever today. Denies abrupt weight changes.  HEENT:  Positive sore throat. Denies eye redness, eye pain, pressure behind the eyes, facial pain, nasal congestion, ear pain, ringing in the ears, wax buildup, runny nose or bloody nose. Respiratory: Positive cough. Denies difficulty breathing or shortness of breath.  Cardiovascular:  Denies chest pain, chest tightness, palpitations or swelling in the hands or feet.   No other specific complaints in a complete review of systems (except as listed in HPI above).  Objective:   .BP 114/66  Pulse 104  Temp(Src) 100 F (37.8 C) (Oral)  Wt 176 lb (79.833 kg)  SpO2 98%  Wt Readings from Last 3 Encounters:  07/15/13 180 lb 8 oz (81.874 kg)  02/26/13 177 lb (80.287 kg)  10/03/12 180 lb 4 oz (81.761 kg)     General: Appears her stated age, ill appearing in NAD. HEENT: Head: normal shape and size; Eyes: sclera white, no icterus, conjunctiva pink, PERRLA and EOMs intact; Ears: Tm's gray and intact, normal light reflex; Nose: mucosa pink and moist, septum midline; Throat/Mouth: + PND. Teeth present, mucosa erythematous and moist, no exudate noted, no lesions or ulcerations noted.  Neck: Mild cervical lymphadenopathy. Neck supple, trachea midline. No massses, lumps or thyromegaly present.  Cardiovascular: Tachycardic with normal rhythm. S1,S2 noted.  No murmur, rubs or gallops noted. No JVD or BLE edema. No carotid bruits noted. Pulmonary/Chest: Normal effort and positive vesicular breath sounds. No respiratory distress. No wheezes, rales or ronchi noted.      Assessment & Plan:   Upper  Respiratory Infection:  Get some rest and drink plenty of water Do salt water gargles for the sore throat eRx for Amoxil BID x 10 days (she reports azithromycin does not work for her) Rx for Hycodan cough syrup Work note provided  RTC as needed or if symptoms persist.

## 2013-08-13 ENCOUNTER — Encounter: Payer: Self-pay | Admitting: Family Medicine

## 2013-08-13 ENCOUNTER — Ambulatory Visit (INDEPENDENT_AMBULATORY_CARE_PROVIDER_SITE_OTHER): Payer: No Typology Code available for payment source | Admitting: Family Medicine

## 2013-08-13 VITALS — BP 112/62 | HR 80 | Temp 98.8°F | Wt 179.5 lb

## 2013-08-13 DIAGNOSIS — R05 Cough: Secondary | ICD-10-CM | POA: Insufficient documentation

## 2013-08-13 DIAGNOSIS — R059 Cough, unspecified: Secondary | ICD-10-CM | POA: Insufficient documentation

## 2013-08-13 MED ORDER — ALBUTEROL SULFATE HFA 108 (90 BASE) MCG/ACT IN AERS: 1.0000 | INHALATION_SPRAY | Freq: Four times a day (QID) | RESPIRATORY_TRACT | Status: DC | PRN

## 2013-08-13 NOTE — Assessment & Plan Note (Signed)
Nontoxic, but cough still noted.  No wheeze on exam today.  Okay for outpatient f/u.  If cough were controlled, she would likely be better off.  Start SABA with routine cautions and instructions.  She agrees. Call back prn. No need seen to ext abx.  She agrees.

## 2013-08-13 NOTE — Progress Notes (Signed)
Pre visit review using our clinic review tool, if applicable. No additional management support is needed unless otherwise documented below in the visit note.  Works at [employer], less exposure to patients than typical.  Sick for almost 2 weeks.  Did have fevers prev up to 102 but not now.  Last fever was last week.  Sweats stopped in the interval.  Still with cough, dry usually.  Only once with any sputum.  Chest sore from coughing, but not sore o/w.  No ST.  No ear pain.  The cough and chest tightness are most bothersome.  hydcodan didn't help at all.  Almost done with amoxil.  She thought she was wheezing, esp supine.  Had used SABA prev but not with this illness.   Meds, vitals, and allergies reviewed.   ROS: See HPI.  Otherwise, noncontributory.  GEN: nad, alert and oriented HEENT: mucous membranes moist, tm w/o erythema, nasal exam w/o erythema, clear discharge noted,  OP with cobblestoning NECK: supple w/o LA CV: rrr.   PULM: ctab, no inc wob EXT: no edema SKIN: no acute rash

## 2013-08-13 NOTE — Patient Instructions (Signed)
Stop the hydrocodone and try the albuterol.  That should help.  It not better at all, then notify me.

## 2013-08-15 ENCOUNTER — Encounter: Payer: Self-pay | Admitting: Family Medicine

## 2013-08-18 ENCOUNTER — Ambulatory Visit (INDEPENDENT_AMBULATORY_CARE_PROVIDER_SITE_OTHER): Payer: PRIVATE HEALTH INSURANCE | Admitting: Family Medicine

## 2013-08-18 ENCOUNTER — Ambulatory Visit (INDEPENDENT_AMBULATORY_CARE_PROVIDER_SITE_OTHER): Payer: PRIVATE HEALTH INSURANCE

## 2013-08-18 VITALS — BP 116/78 | HR 99 | Temp 98.4°F | Resp 18 | Ht 64.5 in | Wt 178.6 lb

## 2013-08-18 DIAGNOSIS — R0602 Shortness of breath: Secondary | ICD-10-CM

## 2013-08-18 DIAGNOSIS — R059 Cough, unspecified: Secondary | ICD-10-CM

## 2013-08-18 DIAGNOSIS — R05 Cough: Secondary | ICD-10-CM

## 2013-08-18 DIAGNOSIS — J0100 Acute maxillary sinusitis, unspecified: Secondary | ICD-10-CM

## 2013-08-18 DIAGNOSIS — J988 Other specified respiratory disorders: Secondary | ICD-10-CM

## 2013-08-18 DIAGNOSIS — J22 Unspecified acute lower respiratory infection: Secondary | ICD-10-CM

## 2013-08-18 MED ORDER — ALBUTEROL SULFATE (2.5 MG/3ML) 0.083% IN NEBU
2.5000 mg | INHALATION_SOLUTION | Freq: Once | RESPIRATORY_TRACT | Status: AC
  Administered 2013-08-18: 2.5 mg via RESPIRATORY_TRACT

## 2013-08-18 MED ORDER — LEVOFLOXACIN 500 MG PO TABS: 500.0000 mg | ORAL_TABLET | Freq: Every day | ORAL | Status: DC

## 2013-08-18 MED ORDER — IPRATROPIUM BROMIDE 0.02 % IN SOLN
0.5000 mg | Freq: Once | RESPIRATORY_TRACT | Status: AC
  Administered 2013-08-18: 0.5 mg via RESPIRATORY_TRACT

## 2013-08-18 MED ORDER — AMOXICILLIN-POT CLAVULANATE 875-125 MG PO TABS: 1.0000 | ORAL_TABLET | Freq: Two times a day (BID) | ORAL | Status: DC

## 2013-08-18 MED ORDER — BENZONATATE 100 MG PO CAPS: 200.0000 mg | ORAL_CAPSULE | Freq: Two times a day (BID) | ORAL | Status: DC | PRN

## 2013-08-18 MED ORDER — HYDROCODONE-HOMATROPINE 5-1.5 MG/5ML PO SYRP: 5.0000 mL | ORAL_SOLUTION | Freq: Every evening | ORAL | Status: DC | PRN

## 2013-08-18 MED ORDER — METHYLPREDNISOLONE (PAK) 4 MG PO TABS: ORAL_TABLET | ORAL | Status: DC

## 2013-08-18 NOTE — Progress Notes (Signed)
Chief Complaint:  Chief Complaint  Patient presents with  . Cough    Been sick since the 17th of July, was given an albuteral inhaler, hydrocodone cough syrup, and amoxicillin, that did not work    . Nasal Congestion    HP Jasmine Nolan is a 43 y.o. female who is here for  > 2 week history of URI sxs and was originally put on amoxacillin and hycodan and was out of work for 3.5 days and was not getting better. On July 29 she came back in to office  and was given albuterol inhaler and used it and did not feel better or sxs improvement. Throught this weekend she feels like there is congestion in her chest and it has travelled back into her face, she currently has diffuse HA, and also nasal congestion. She is coughing up some yellow sputum. She is "tired of being sick". She has a history of PNA. + SOB, wheeze, tiredness, chills without fever  Past Medical History  Diagnosis Date  . Depression   . MVP (mitral valve prolapse)   . Arthritis   . Obesity   . Sinusitis   . Lateral epicondylitis of elbow   . Inguinal mass   . Internal hemorrhoids   . Gout   . IBS (irritable bowel syndrome)   . Pneumonia   . Anxiety    Past Surgical History  Procedure Laterality Date  . Cesarean section  2001  . Lumbar disc surgery  June 2011  . Tubal ligation    . Endometrial ablation    . Cervical fusion  2011    Cram, 2 level  . Spine surgery     History   Social History  . Marital Status: Divorced    Spouse Name: N/A    Number of Children: 1  . Years of Education: N/A   Occupational History  . medical billing    Social History Main Topics  . Smoking status: Former Smoker    Quit date: 01/17/2008  . Smokeless tobacco: Never Used  . Alcohol Use: Yes     Comment: rarely  . Drug Use: No  . Sexual Activity: None   Other Topics Concern  . None   Social History Narrative  . None   Family History  Problem Relation Age of Onset  . Colon cancer Father 17    died of colon CA  .  Depression Mother   . Arthritis Mother    Allergies  Allergen Reactions  . Bactrim Nausea And Vomiting  . Dicyclomine Hcl Rash  . Tetanus Toxoids Other (See Comments)    fever   Prior to Admission medications   Medication Sig Start Date End Date Taking? Authorizing Provider  albuterol (PROVENTIL HFA;VENTOLIN HFA) 108 (90 BASE) MCG/ACT inhaler Inhale 1-2 puffs into the lungs every 6 (six) hours as needed for wheezing or shortness of breath. 08/13/13  Yes Joaquim Nam, MD  buPROPion [facility] SR) 150 MG 12 hr tablet Take 150 mg by mouth 2 (two) times daily.   Yes Historical Provider, MD  omeprazole (PRILOSEC) 40 MG capsule Take 40 mg by mouth daily as needed. 02/26/13  Yes Hilarie Fredrickson, MD  sertraline (ZOLOFT) 100 MG tablet Take 150 mg by mouth daily.    Yes Historical Provider, MD     ROS: The patient denies fevers,  night sweats, unintentional weight loss, chest pain, palpitations,  nausea, vomiting, abdominal pain, dysuria, hematuria, melena, numbness,  or tingling. +  SOB, wheeze, weak   All other systems have been reviewed and were otherwise negative with the exception of those mentioned in the HPI and as above.    PHYSICAL EXAM: Filed Vitals:   08/18/13 0900  BP: 116/78  Pulse: 99  Temp: 98.4 F (36.9 C)  Resp: 18   Filed Vitals:   08/18/13 0900  Height: 5' 4.5" (1.638 m)  Weight: 178 lb 9.6 oz (81.012 kg)   Body mass index is 30.19 kg/(m^2).  General: Alert, no acute distress HEENT:  Normocephalic, atraumatic, oropharynx patent. EOMI, PERRLA, TM nl, + minimal sinus tenderness, erythematous throat, PND.  Cardiovascular:  Regular rate and rhythm, no rubs murmurs or gallops.  No Carotid bruits, radial pulse intact. No pedal edema.  Respiratory: Clear to auscultation bilaterally.   No cyanosis, no use of accessory musculature. She has decrease air flow, distant BS on initial exam and then after she received the neb treatment you can hear the rhonchi  GI: No  organomegaly, abdomen is soft and non-tender, positive bowel sounds.  No masses. Skin: No rashes. Neurologic: Facial musculature symmetric. Psychiatric: Patient is appropriate throughout our interaction. Lymphatic: No cervical lymphadenopathy Musculoskeletal: Gait intact.   LABS: Results for orders placed in visit on 07/15/13  URINE CULTURE      Result Value Ref Range   Colony Count 4,000 COLONIES/ML     Organism ID, Bacteria Insignificant Growth       EKG/XRAY:   Primary read [facility]. No effusion, pneoumothorax ? RLL infiltrate vs increase vascuilar markings   ASSESSMENT/PLAN: Encounter Diagnoses  Name Primary?  . Lower respiratory infection (e.g., bronchitis, pneumonia, pneumonitis, pulmonitis) Yes  . SOB (shortness of breath)   . Cough   . 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 Her chest xray shows increase vascular markings c/w bornchitis. She also felt better after her neb treatment, more audible rhoncerous BS after treatment.  Rx levaquin Refill hycodan OTc nasacort NS, if no improvement then can use medrol dose pack D/w  patient official Xray results F/u prn  Gross sideeffects, risk and benefits, and alternatives of medications d/w patient. Patient is aware that all medications have potential sideeffects and we are unable to predict every sideeffect or drug-drug interaction that may occur.  Junice Fei PHUONG, DO 08/18/2013 11:03 AM

## 2013-08-26 ENCOUNTER — Ambulatory Visit: Payer: Self-pay | Admitting: Family Medicine

## 2013-08-27 ENCOUNTER — Ambulatory Visit: Payer: No Typology Code available for payment source | Admitting: Family Medicine

## 2013-08-28 ENCOUNTER — Encounter: Payer: Self-pay | Admitting: Family Medicine

## 2013-08-28 ENCOUNTER — Ambulatory Visit (INDEPENDENT_AMBULATORY_CARE_PROVIDER_SITE_OTHER): Payer: No Typology Code available for payment source | Admitting: Family Medicine

## 2013-08-28 ENCOUNTER — Encounter: Payer: Self-pay | Admitting: *Deleted

## 2013-08-28 VITALS — BP 128/80 | HR 98 | Temp 98.2°F | Wt 174.8 lb

## 2013-08-28 DIAGNOSIS — J329 Chronic sinusitis, unspecified: Secondary | ICD-10-CM | POA: Insufficient documentation

## 2013-08-28 DIAGNOSIS — J181 Lobar pneumonia, unspecified organism: Principal | ICD-10-CM

## 2013-08-28 DIAGNOSIS — J0110 Acute frontal sinusitis, unspecified: Secondary | ICD-10-CM

## 2013-08-28 DIAGNOSIS — J189 Pneumonia, unspecified organism: Secondary | ICD-10-CM | POA: Insufficient documentation

## 2013-08-28 DIAGNOSIS — J0111 Acute recurrent frontal sinusitis: Secondary | ICD-10-CM

## 2013-08-28 MED ORDER — BUDESONIDE-FORMOTEROL FUMARATE 80-4.5 MCG/ACT IN AERO: 2.0000 | INHALATION_SPRAY | Freq: Two times a day (BID) | RESPIRATORY_TRACT | Status: DC

## 2013-08-28 MED ORDER — FLUTICASONE-SALMETEROL 250-50 MCG/DOSE IN AEPB: 1.0000 | INHALATION_SPRAY | Freq: Two times a day (BID) | RESPIRATORY_TRACT | Status: DC

## 2013-08-28 MED ORDER — DEXAMETHASONE SODIUM PHOSPHATE 10 MG/ML IJ SOLN
10.0000 mg | Freq: Once | INTRAMUSCULAR | Status: AC
  Administered 2013-08-28: 10 mg via INTRAVENOUS

## 2013-08-28 NOTE — Progress Notes (Signed)
Subjective:   Patient ID: Jasmine Nolan, female    DOB: 1970-12-06, 43 y.o.   MRN: [id number]  Jasmine Nolan is a pleasant 43 y.o. year old female who presents to clinic today with Follow-up  on 08/28/2013  HPI: Notes reviewed.  Saw Nicki Reaper on 7/21 for 4 days of cough, congestion.  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 Given 10 day course of amoxicillin twice daily and hydcodan prn cough.  Followed up with Dr. Para March on 7/29- given albuterol inhaler.  Went to [facility] on 8/3-  Lungs clear initially on exam but rhonchi audible per note after neb tx.  CXR final read was neg but ? Possible RLL increased vascular markings on initial read.  Treated with levaquin, tessaolon perles, hycodan refilled, advised OTC nasocort and given medrol dose pack to fill if symptoms did not improve (she did not take this).  Saw Dr. Willeen Cass at [facility] on 8/7- he extended levaquin an additional 10 days- per pt, she was told "he still heard rattling in her lungs."  Also given prednisone dpse pack.  Here today because " I have been sick for 26 days and I am exhausted and frustrated."  Cough is not as bad but ribs very sore when she coughs.  Not as short of breath but has DOE very easily.    Has been taking mucinex sporadically- not sure if it helped.  Taking albuterol at least 2-3 times a day- does get temporary relief. Last dose of prednisone was two days ago- it did make her feel better.   Current Outpatient Prescriptions on File Prior to Visit  Medication Sig Dispense Refill  . albuterol (PROVENTIL HFA;VENTOLIN HFA) 108 (90 BASE) MCG/ACT inhaler Inhale 1-2 puffs into the lungs every 6 (six) hours as needed for wheezing or shortness of breath.  1 Inhaler  2  . benzonatate (TESSALON) 100 MG capsule Take 2 capsules (200 mg total) by mouth 2 (two) times daily as needed.  30 capsule  1  . buPROPion (WELLBUTRIN SR) 150 MG 12 hr tablet Take 150 mg by mouth 2 (two) times daily.       Marland Kitchen HYDROcodone-homatropine (HYCODAN) 5-1.5 MG/5ML syrup Take 5 mLs by mouth at bedtime as needed.  120 mL  0  . levofloxacin (LEVAQUIN) 500 MG tablet Take 1 tablet (500 mg total) by mouth daily.  7 tablet  0  . sertraline (ZOLOFT) 100 MG tablet Take 150 mg by mouth daily.        No current facility-administered medications on file prior to visit.    Allergies  Allergen Reactions  . Bactrim Nausea And Vomiting  . Dicyclomine Hcl Rash  . Tetanus Toxoids Other (See Comments)    fever    Past Medical History  Diagnosis Date  . Depression   . MVP (mitral valve prolapse)   . Arthritis   . Obesity   . Sinusitis   . Lateral epicondylitis of elbow   . Inguinal mass   . Internal hemorrhoids   . Gout   . IBS (irritable bowel syndrome)   . Pneumonia   . Anxiety     Past Surgical History  Procedure Laterality Date  . Cesarean section  2001  . Lumbar disc surgery  June 2011  . Tubal ligation    . Endometrial ablation    . Cervical fusion  2011    Cram, 2 level  . Spine surgery      Family  History  Problem Relation Age of Onset  . Colon cancer Father [id number]    died of colon CA  . Depression Mother   . Arthritis Mother     History   Social History  . Marital Status: Divorced    Spouse Name: N/A    Number of Children: 1  . Years of Education: N/A   Occupational History  . medical billing    Social History Main Topics  . Smoking status: Former Smoker    Quit date: 01/17/2008  . Smokeless tobacco: Never Used  . Alcohol Use: Yes     Comment: rarely  . Drug Use: No  . Sexual Activity: Not on file   Other Topics Concern  . Not on file   Social History Narrative  . No narrative on file   The PMH, PSH, Social History, Family History, Medications, and allergies have been reviewed in [facility], and have been updated if relevant.  Review of Systems Decreased appetite- "I only eat when I take my medication." Wt Readings from Last 3 Encounters:  08/28/13 174 lb 12 oz  (79.266 kg)  08/18/13 178 lb 9.6 oz (81.012 kg)  08/13/13 179 lb 8 oz (81.421 kg)   No fever- last fever was 7/25    Objective:    BP 128/80  Pulse 98  Temp(Src) 98.2 F (36.8 C) (Oral)  Wt 174 lb 12 oz (79.266 kg)  SpO2 94%   Physical Exam Gen:  Alert, tearful but NAD HEENT:  TMs clear bilaterally Resp:  No increased WOB +bilateral ronchi and faint scattered exp wheezes CVS: RRR Ext: no edema Neuro:  Normal gait Psych:  Good eye contact, tearful but appropriate     Assessment & Plan:   Right lower lobe pneumonia  Acute recurrent frontal sinusitis No Follow-up on file.

## 2013-08-28 NOTE — Progress Notes (Signed)
Pre visit review using our clinic review tool, if applicable. No additional management support is needed unless otherwise documented below in the visit note. 

## 2013-08-28 NOTE — Addendum Note (Signed)
Addended by: Desmond DikeKNIGHT, Daneesha Quinteros H on: 08/28/2013 07:47 AM   Modules accepted: Orders

## 2013-08-28 NOTE — Assessment & Plan Note (Signed)
Symptoms persistent. She is understandably frustrated.  >25 minutes spent in face to face time with patient, >50% spent in counselling or coordination of care   CXR neg- s/p long course of levaquin and amoxicillin. Long discussion with [name] about this.  At this point, likely not bacterial process. Could have been viral but now clearly has reactive airway inflammation.  Prednisone did help- had a 5 day taper which likely was not sufficient. IM decardon given today and will start Advair 250 twice daily.  She will call me with an update of her symptoms tomorrow.

## 2013-08-28 NOTE — Addendum Note (Signed)
Addended by: Desmond DikeKNIGHT, Tamsyn Owusu H on: 08/28/2013 09:37 AM   Modules accepted: Orders, Medications

## 2013-08-28 NOTE — Patient Instructions (Signed)
We are starting Advair- 1 puff twice daily.  Please update me with yours symptoms tomorrow.

## 2013-08-29 ENCOUNTER — Telehealth: Payer: Self-pay

## 2013-08-29 NOTE — Telephone Encounter (Signed)
Returned pt's call. Feels like she is making "baby steps" in the right direction. She will update us next week.

## 2013-08-29 NOTE — Telephone Encounter (Signed)
Pt left v/m; pt was seen on 08/28/13; today pt feels slightly better; still some wheeze and gurgle when breathes. SOB is same as usually has. No fever. New inhaler seems to help. Pt request cb.

## 2013-09-03 ENCOUNTER — Encounter: Payer: Self-pay | Admitting: Family Medicine

## 2013-09-04 ENCOUNTER — Ambulatory Visit (INDEPENDENT_AMBULATORY_CARE_PROVIDER_SITE_OTHER): Payer: No Typology Code available for payment source | Admitting: Family Medicine

## 2013-09-04 ENCOUNTER — Encounter: Payer: Self-pay | Admitting: Family Medicine

## 2013-09-04 VITALS — BP 112/62 | HR 97 | Temp 98.6°F | Wt 176.5 lb

## 2013-09-04 DIAGNOSIS — R0789 Other chest pain: Secondary | ICD-10-CM

## 2013-09-04 DIAGNOSIS — R071 Chest pain on breathing: Secondary | ICD-10-CM

## 2013-09-04 DIAGNOSIS — R079 Chest pain, unspecified: Secondary | ICD-10-CM | POA: Insufficient documentation

## 2013-09-04 MED ORDER — HYDROCODONE-ACETAMINOPHEN 5-325 MG PO TABS: 1.0000 | ORAL_TABLET | Freq: Four times a day (QID) | ORAL | Status: DC | PRN

## 2013-09-04 NOTE — Assessment & Plan Note (Signed)
Doesn't appear to be intrathoracic or intraabdominal.  D/w pt.  Likely from coughing, likely muscle strain.  Rib fx possible, d/w pt. Offered xray, thought it wouldn't likely change mgmt, she declined.  Cough is better, continue with inhalers, use vicodin for pain with sedation caution.  Continue ibuprofen with GI caution.   She'll update us tomorrow.  We can try to address the head congestion at that point, consider adding on antihistamine.  We agreed to make 1 change at a time today.

## 2013-09-04 NOTE — Patient Instructions (Signed)
Keep taking the ibuprofen with food and use the vicodin as needed.   Take care.  Glad to see you.

## 2013-09-04 NOTE — Progress Notes (Signed)
Pre visit review using our clinic review tool, if applicable. No additional management support is needed unless otherwise documented below in the visit note.  Long illness with completed course of ABX, cough now better with symbicort and SABA.  Done with pred taper.  No fevers.    Now with pain on the R side of lower chest wall.  Near the R lower ribs.  Pain with a deep breathing, cough, sneezing.  Less pain if still.  No vomiting.  Pain has been going on for 1.5 weeks.  Clearly worse with cough.  No dysuria.  No blood in urine.  No h/o renal stones.  No rash, no bruising.  She's already taking ibuprofen.    She continues to have sig nasal/head congestion.   Meds, vitals, and allergies reviewed.   ROS: See HPI.  Otherwise, noncontributory.  nad ncat OP wnl, mmm Neck supple rrr ctab Abd soft R lower anterior chest wall ttp.  Pain at the same area with a deep breath, better with compression during a deep breath.   No rash no bruising

## 2013-09-05 ENCOUNTER — Encounter: Payer: Self-pay | Admitting: Family Medicine

## 2013-09-26 ENCOUNTER — Encounter: Payer: Self-pay | Admitting: Family Medicine

## 2013-09-26 ENCOUNTER — Other Ambulatory Visit: Payer: Self-pay | Admitting: Family Medicine

## 2013-09-26 DIAGNOSIS — J988 Other specified respiratory disorders: Secondary | ICD-10-CM

## 2013-10-02 ENCOUNTER — Other Ambulatory Visit: Payer: No Typology Code available for payment source

## 2013-10-02 ENCOUNTER — Ambulatory Visit (INDEPENDENT_AMBULATORY_CARE_PROVIDER_SITE_OTHER): Payer: No Typology Code available for payment source | Admitting: Pulmonary Disease

## 2013-10-02 ENCOUNTER — Encounter: Payer: Self-pay | Admitting: Pulmonary Disease

## 2013-10-02 ENCOUNTER — Institutional Professional Consult (permissible substitution): Payer: No Typology Code available for payment source | Admitting: Pulmonary Disease

## 2013-10-02 VITALS — BP 130/76 | HR 92 | Ht 64.0 in | Wt 180.0 lb

## 2013-10-02 DIAGNOSIS — J45909 Unspecified asthma, uncomplicated: Secondary | ICD-10-CM

## 2013-10-02 DIAGNOSIS — J4530 Mild persistent asthma, uncomplicated: Secondary | ICD-10-CM

## 2013-10-02 DIAGNOSIS — J3089 Other allergic rhinitis: Secondary | ICD-10-CM

## 2013-10-02 DIAGNOSIS — Z23 Encounter for immunization: Secondary | ICD-10-CM

## 2013-10-02 DIAGNOSIS — J309 Allergic rhinitis, unspecified: Secondary | ICD-10-CM | POA: Insufficient documentation

## 2013-10-02 DIAGNOSIS — R062 Wheezing: Secondary | ICD-10-CM

## 2013-10-02 NOTE — Patient Instructions (Signed)
Refill the albuterol and symbicort and continue taking the symbicort twice a day and the albuterol as needed We will make arrangement for the lung function test.  If that is normal then we will arrange for a methacholine challenge test Start taking generic zyrtec daily and nasacort two sprays each nostril daily We will see you back in 4-6 weeks or sooner if needed

## 2013-10-02 NOTE — Assessment & Plan Note (Addendum)
She describes recurrent episodes of upper respiratory infections associated with shortness of breath, throat tightness, cough, and mucus production. I explained to her today that the differential diagnosis includes adult onset asthma versus recurrent upper airway infections with persistent laryngeal edema and tracheitis. It is possible that these upper airway infections are perpetuated by allergic rhinitis which is currently untreated.  I have a high index of suspicion for vocal cord dysfunction but we need to rule out asthma first.  At this point I think it is safest to continue taking the Symbicort and as needed albuterol until we can perform lung function testing.  I have are viewed her most recent chest x-ray and did not see any clear lung abnormalities.  Plan: -Full pulmonary function testing, paying attention to inspiratory loop for upper airway obstruction -If full pulmonary function testing is normal then send her for methacholine challenge to rule out asthma -Continue Symbicort for now -Continue as needed albuterol for now -Serum IgE level today -Followup in 4-6 weeks

## 2013-10-02 NOTE — Assessment & Plan Note (Signed)
This is currently untreated and contributing to her symptoms.  Plan: -Start generic Zyrtec -Nasacort over-the-counter, appropriate use and instructions provided.

## 2013-10-02 NOTE — Progress Notes (Signed)
Subjective:    Patient ID: Jasmine Nolan, female    DOB: 1970/11/19, 43 y.o.   MRN: [id number]  HPI   This is a very pleasant 43 year old female who comes to my clinic today for evaluation of recurrent upper airway infections. She states that her childhood was normal without respiratory infections but approximately 10 years ago she had 2 episodes of pneumonia which were treated as an outpatient. After that she did not have significant respiratory problems until 2015. This year she has recurrent cough, chest tightness, and wheezing. These have been associated with recurrent upper airway infections. She has been seen by her primary care physician multiple times as well as her ear nose and throat physician. She has been treated with antibiotics and prednisone. Most recently she was started on Symbicort as well as as needed albuterol.  She describes the sensation of increasing shortness of breath throughout the summer. She says that she feels like her throat is closing up quite frequently. She has had difficulty participating in activities such as vacuuming. This is very unusual for her as she never had problems with shortness of breath in the past. She has not had leg swelling. She does have some chest tightness at times.  She says that certain environmental irritants will cause her to have chest tightness and coughing. Specifically, she says that cigarette smoke and hairspray will often do this. This has been more of a problem this year.   She has no family history of lung problems.  She smoked one half to one pack a day "sporadically" for about 20 years. She says she has not touched a cigarette in 5 years.   Past Medical History  Diagnosis Date  . Depression   . MVP (mitral valve prolapse)   . Arthritis   . Obesity   . Sinusitis   . Lateral epicondylitis of elbow   . Inguinal mass   . Internal hemorrhoids   . Gout   . IBS (irritable bowel syndrome)   . Pneumonia   . Anxiety       Family History  Problem Relation Age of Onset  . Colon cancer Father 29    died of colon CA  . Depression Mother   . Arthritis Mother      History   Social History  . Marital Status: Divorced    Spouse Name: N/A    Number of Children: 1  . Years of Education: N/A   Occupational History  . medical billing    Social History Main Topics  . Smoking status: Former Smoker -- 0.50 packs/day for 20 years    Types: Cigarettes    Quit date: 01/17/2008  . Smokeless tobacco: Never Used     Comment: "off and on smoker"  . Alcohol Use: Yes     Comment: rarely  . Drug Use: No  . Sexual Activity: Not on file   Other Topics Concern  . Not on file   Social History Narrative  . No narrative on file     Allergies  Allergen Reactions  . Bactrim Nausea And Vomiting  . Dicyclomine Hcl Rash  . Tetanus Toxoids Other (See Comments)    fever     Outpatient Prescriptions Prior to Visit  Medication Sig Dispense Refill  . albuterol (PROVENTIL HFA;VENTOLIN HFA) 108 (90 BASE) MCG/ACT inhaler Inhale 1-2 puffs into the lungs every 6 (six) hours as needed for wheezing or shortness of breath.  1 Inhaler  2  . budesonide-formoterol (SYMBICORT)  80-4.5 MCG/ACT inhaler Inhale 2 puffs into the lungs 2 (two) times daily.  1 Inhaler  3  . buPROPion (WELLBUTRIN SR) 150 MG 12 hr tablet Take 150 mg by mouth 2 (two) times daily.      Marland Kitchen HYDROcodone-acetaminophen (NORCO/VICODIN) 5-325 MG per tablet Take 1 tablet by mouth every 6 (six) hours as needed for severe pain (sedation caution.).  30 tablet  0  . sertraline (ZOLOFT) 100 MG tablet Take 150 mg by mouth daily.        No facility-administered medications prior to visit.      Review of Systems  Constitutional: Negative for fever and unexpected weight change.  HENT: Positive for congestion, postnasal drip, rhinorrhea, sinus pressure and voice change. Negative for dental problem, ear pain, nosebleeds, sneezing, sore throat and trouble swallowing.    Eyes: Negative for redness and itching.  Respiratory: Positive for cough, chest tightness and shortness of breath. Negative for wheezing.   Cardiovascular: Negative for palpitations and leg swelling.  Gastrointestinal: Negative for nausea and vomiting.  Genitourinary: Negative for dysuria.  Musculoskeletal: Negative for joint swelling.  Skin: Negative for rash.  Neurological: Negative for headaches.  Hematological: Does not bruise/bleed easily.  Psychiatric/Behavioral: Negative for dysphoric mood. The patient is not nervous/anxious.        Objective:   Physical Exam Filed Vitals:   10/02/13 1223  BP: 130/76  Pulse: 92  Height:  (1.626 m)  Weight: 180 lb (81.647 kg)  SpO2: 96%   Gen: well appearing, no acute distress HEENT: NCAT, PERRL, EOMi, OP clear, neck supple without masses PULM: CTA B CV: RRR, no mgr, no JVD AB: BS+, soft, nontender, no hsm Ext: warm, no edema, no clubbing, no cyanosis Derm: no rash or skin breakdown Neuro: A&Ox4, CN II-XII intact, strength 5/5 in all 4 extremities   August 2015 chest x-ray reviewed> perhaps there is some hyperinflation but there is no obvious abnormality of the tracheal air columns or lungs.     Assessment & Plan:   Wheezing She describes recurrent episodes of upper respiratory infections associated with shortness of breath, throat tightness, cough, and mucus production. I explained to her today that the differential diagnosis includes adult onset asthma versus recurrent upper airway infections with persistent laryngeal edema and tracheitis. It is possible that these upper airway infections are perpetuated by allergic rhinitis which is currently untreated.  I have a high index of suspicion for vocal cord dysfunction but we need to rule out asthma first.  At this point I think it is safest to continue taking the Symbicort and as needed albuterol until we can perform lung function testing.  I have are viewed her most recent  chest x-ray and did not see any clear lung abnormalities.  Plan: -Full pulmonary function testing, paying attention to inspiratory loop for upper airway obstruction -If full pulmonary function testing is normal then send her for methacholine challenge to rule out asthma -Continue Symbicort for now -Continue as needed albuterol for now -Serum IgE level today -Followup in 4-6 weeks  Allergic rhinitis This is currently untreated and contributing to her symptoms.  Plan: -Start generic Zyrtec -Nasacort over-the-counter, appropriate use and instructions provided.    Updated Medication List Outpatient Encounter Prescriptions as of 10/02/2013  Medication Sig  . albuterol (PROVENTIL HFA;VENTOLIN HFA) 108 (90 BASE) MCG/ACT inhaler Inhale 1-2 puffs into the lungs every 6 (six) hours as needed for wheezing or shortness of breath.  . budesonide-formoterol (SYMBICORT) 80-4.5 MCG/ACT inhaler Inhale 2 puffs  into the lungs 2 (two) times daily.  Marland Kitchen buPROPion (WELLBUTRIN SR) 150 MG 12 hr tablet Take 150 mg by mouth 2 (two) times daily.  Marland Kitchen HYDROcodone-acetaminophen (NORCO/VICODIN) 5-325 MG per tablet Take 1 tablet by mouth every 6 (six) hours as needed for severe pain (sedation caution.).  Marland Kitchen sertraline (ZOLOFT) 100 MG tablet Take 150 mg by mouth daily.

## 2013-10-03 LAB — IGE: IgE (Immunoglobulin E), Serum: 4 kU/L (ref <115)

## 2013-10-06 ENCOUNTER — Encounter: Payer: Self-pay | Admitting: Pulmonary Disease

## 2013-10-06 MED ORDER — BUDESONIDE-FORMOTEROL FUMARATE 80-4.5 MCG/ACT IN AERO: 2.0000 | INHALATION_SPRAY | Freq: Two times a day (BID) | RESPIRATORY_TRACT | Status: DC

## 2013-10-06 MED ORDER — ALBUTEROL SULFATE HFA 108 (90 BASE) MCG/ACT IN AERS: 1.0000 | INHALATION_SPRAY | Freq: Four times a day (QID) | RESPIRATORY_TRACT | Status: DC | PRN

## 2013-10-09 ENCOUNTER — Encounter: Payer: Self-pay | Admitting: Pulmonary Disease

## 2013-10-09 ENCOUNTER — Ambulatory Visit: Payer: Self-pay | Admitting: Pulmonary Disease

## 2013-10-09 NOTE — Telephone Encounter (Signed)
Dr Kendrick Fries please advise on results of IgE labs. Thanks.

## 2013-10-12 ENCOUNTER — Encounter: Payer: Self-pay | Admitting: Pulmonary Disease

## 2013-10-13 ENCOUNTER — Telehealth: Payer: Self-pay

## 2013-10-13 DIAGNOSIS — R059 Cough, unspecified: Secondary | ICD-10-CM

## 2013-10-13 DIAGNOSIS — R05 Cough: Secondary | ICD-10-CM

## 2013-10-13 NOTE — Telephone Encounter (Signed)
Spoke with pt, she is aware of results.   Dr. Kendrick Fries, do you want me to order a methacholine challenge for her, as recommended at her last ov if the pft was normal? Thanks!

## 2013-10-13 NOTE — Telephone Encounter (Signed)
Methacholine challenge ordered.  lmtcb X1 to let patient know.

## 2013-10-13 NOTE — Progress Notes (Signed)
Quick Note:  Spoke with pt, she is aware of results. Nothing further needed. ______ 

## 2013-10-13 NOTE — Telephone Encounter (Signed)
Message copied by Velvet Bathe on Mon Oct 13, 2013  4:47 PM ------      Message from: Lupita Leash      Created: Sun Oct 12, 2013  4:00 AM       A,      Please let her know that her PFT was normal and didn't show evidence of asthma.      Thanks      B ------

## 2013-10-13 NOTE — Telephone Encounter (Signed)
Yes please

## 2013-10-14 NOTE — Telephone Encounter (Signed)
Spoke with pt, she is aware of recs.  Nothing further.

## 2013-10-20 ENCOUNTER — Ambulatory Visit: Payer: Self-pay | Admitting: Pulmonary Disease

## 2013-10-29 ENCOUNTER — Encounter: Payer: Self-pay | Admitting: Pulmonary Disease

## 2013-10-29 NOTE — Telephone Encounter (Signed)
Dr Kendrick FriesMcQuaid, please advise if you have seen the patients Methacholine Challenge test results. Pt is requesting these. Completed on 10/20/13? Thanks .

## 2013-11-17 ENCOUNTER — Ambulatory Visit: Payer: No Typology Code available for payment source | Admitting: Pulmonary Disease

## 2013-12-03 ENCOUNTER — Ambulatory Visit (INDEPENDENT_AMBULATORY_CARE_PROVIDER_SITE_OTHER): Payer: No Typology Code available for payment source | Admitting: Pulmonary Disease

## 2013-12-03 ENCOUNTER — Encounter: Payer: Self-pay | Admitting: Pulmonary Disease

## 2013-12-03 VITALS — BP 106/60 | HR 87 | Ht 64.0 in | Wt 178.0 lb

## 2013-12-03 DIAGNOSIS — R062 Wheezing: Secondary | ICD-10-CM

## 2013-12-03 DIAGNOSIS — Z23 Encounter for immunization: Secondary | ICD-10-CM

## 2013-12-03 NOTE — Patient Instructions (Signed)
You do not have Asthma  When you get a respiratory infection For sinus symptoms use saline rinses with Lloyd HugerNeil Med rinses, chlortrimeton with phenylephrine combination tablets For cough use Delsym (use this around the clock to prevent excessive vocal cord inflammation)  Take the medications early on in the cold to try to prevent excessive inflammation in your voice box (larynx).  We will call you when we have the results of the

## 2013-12-03 NOTE — Assessment & Plan Note (Signed)
[name] does not have asthma as her methacholine challenge test was negative.  Further her chest x-ray was normal and her lung exam is normal so I have no evidence that she has a lung condition.  I explained to her today that her recurrent cough and wheeze in the setting of URI's could be explained by laryngitis alone.  She should focus her therapy on cough suppression and control of sinus symptoms in the setting of a URI.  She should stop taking symbicort

## 2013-12-03 NOTE — Progress Notes (Signed)
   Subjective:    Patient ID: Jasmine Nolan, female    DOB: [birth year], 43 y.o.   MRN: [id number]  Synopsis: Referred in 2015 for recurrent wheezing after URI's.  Had negative methacholine challenge test in 2015. 2015 PFT> normal 2015 CXR> normal  HPI   Chief Complaint  Patient presents with  . Follow-up    Pt c/o sob with exertion, states she is better since last visit.  States she feels like she wakes up with her airway constricted in the morning until she showers. Review pft and methacholine challenge.    12/03/2013 ROV > Jasmine Nolan went for the methacholine challenge test and said that she ad to do multiple rounds of FEV1 maneuvers.  She says that she has been feeling better lately but she has ben experiencing some chest tightness in the mornings.  She says that when she gets in the shower it helps "open her up".    Past Medical History  Diagnosis Date  . Depression   . MVP (mitral valve prolapse)   . Arthritis   . Obesity   . Sinusitis   . Lateral epicondylitis of elbow   . Inguinal mass   . Internal hemorrhoids   . Gout   . IBS (irritable bowel syndrome)   . Pneumonia   . Anxiety         Review of Systems     Objective:   Physical Exam Filed Vitals:   12/03/13 1608  BP: 106/60  Pulse: 87  Height: 5\' 4"  (1.626 m)  Weight: 178 lb (80.74 kg)  SpO2: 96%   RA  Gen: well appearing, no acute distress HEENT: NCAT, EOMi, OP clear,  PULM: CTA B CV: RRR, no mgr, no JVD AB: BS+, soft, nontender,  Ext: warm, no edema, no clubbing, no cyanosis Derm: no rash or skin breakdown Neuro: A&Ox4, MAEW        Assessment & Plan:   Wheezing Jasmine Nolan does not have asthma as her methacholine challenge test was negative.  Further her chest x-ray was normal and her lung exam is normal so I have no evidence that she has a lung condition.  I explained to her today that her recurrent cough and wheeze in the setting of URI's could be explained by laryngitis alone.  She should focus her  therapy on cough suppression and control of sinus symptoms in the setting of a URI.  She should stop taking symbicort    Updated Medication List Outpatient Encounter Prescriptions as of 12/03/2013  Medication Sig  . albuterol (PROVENTIL HFA;VENTOLIN HFA) 108 (90 BASE) MCG/ACT inhaler Inhale 1-2 puffs into the lungs every 6 (six) hours as needed for wheezing or shortness of breath.  . budesonide-formoterol (SYMBICORT) 80-4.5 MCG/ACT inhaler Inhale 2 puffs into the lungs 2 (two) times daily.  Marland Kitchen. buPROPion (WELLBUTRIN SR) 150 MG 12 hr tablet Take 150 mg by mouth 2 (two) times daily.  Marland Kitchen. HYDROcodone-acetaminophen (NORCO/VICODIN) 5-325 MG per tablet Take 1 tablet by mouth every 6 (six) hours as needed for severe pain (sedation caution.).  Marland Kitchen. sertraline (ZOLOFT) 100 MG tablet Take 150 mg by mouth daily.

## 2014-03-12 ENCOUNTER — Ambulatory Visit (INDEPENDENT_AMBULATORY_CARE_PROVIDER_SITE_OTHER): Payer: No Typology Code available for payment source | Admitting: Family Medicine

## 2014-03-12 ENCOUNTER — Encounter: Payer: Self-pay | Admitting: Family Medicine

## 2014-03-12 ENCOUNTER — Encounter: Payer: Self-pay | Admitting: *Deleted

## 2014-03-12 VITALS — BP 124/92 | HR 108 | Temp 98.8°F | Wt 177.8 lb

## 2014-03-12 DIAGNOSIS — J0100 Acute maxillary sinusitis, unspecified: Secondary | ICD-10-CM

## 2014-03-12 DIAGNOSIS — J019 Acute sinusitis, unspecified: Secondary | ICD-10-CM | POA: Insufficient documentation

## 2014-03-12 MED ORDER — HYDROCODONE-HOMATROPINE 5-1.5 MG/5ML PO SYRP: 5.0000 mL | ORAL_SOLUTION | Freq: Every evening | ORAL | Status: DC | PRN

## 2014-03-12 MED ORDER — AMOXICILLIN-POT CLAVULANATE 875-125 MG PO TABS: 1.0000 | ORAL_TABLET | Freq: Two times a day (BID) | ORAL | Status: AC

## 2014-03-12 NOTE — Progress Notes (Signed)
BP 124/92 mmHg  Pulse 108  Temp(Src) 98.8 F (37.1 C) (Oral)  Wt 177 lb 12 oz (80.627 kg)   CC: "I feel like i've been hit by bus" sinus congestion  Subjective:    Patient ID: Jasmine Nolan, female    DOB: [birth year], 44 y.o.   MRN: [id number]  HPI: Jasmine Nolan is a 44 y.o. female presenting on 03/12/2014 for Sinusitis   4d h/o myalgias, malaise, headache, nasal congestion, yesterday cough throughout the night. Rattling cough but nothing coming up. Fever yesterday to 101, not today. R upper jaw pain. + PNdrainage and ST.  No ear pain, abd pain, nausea, vomiting or diarrhea. No significant body aches or myalgias.   So far tried advil cold and sinus, benadryl, ibuprofen.   Denies history of asthma or COPD.  Quit smoking 2010.  No sick contacts at home.  Did receive flu shot this year.   Last year had prolonged illness that started similarly. Saw Dr Kendrick Fries, normal workup including PFTs and methylcholine challenge test.   wellbutrin ineffective. Removed from list.   Relevant past medical, surgical, family and social history reviewed and updated as indicated. Interim medical history since our last visit reviewed. Allergies and medications reviewed and updated. Current Outpatient Prescriptions on File Prior to Visit  Medication Sig  . sertraline (ZOLOFT) 100 MG tablet Take 200 mg by mouth daily.   Marland Kitchen albuterol (PROVENTIL HFA;VENTOLIN HFA) 108 (90 BASE) MCG/ACT inhaler Inhale 1-2 puffs into the lungs every 6 (six) hours as needed for wheezing or shortness of breath. (Patient not taking: Reported on 03/12/2014)  . budesonide-formoterol (SYMBICORT) 80-4.5 MCG/ACT inhaler Inhale 2 puffs into the lungs 2 (two) times daily. (Patient not taking: Reported on 03/12/2014)   No current facility-administered medications on file prior to visit.    Review of Systems Per HPI unless specifically indicated above     Objective:    BP 124/92 mmHg  Pulse 108  Temp(Src) 98.8 F (37.1 C) (Oral)   Wt 177 lb 12 oz (80.627 kg)  Wt Readings from Last 3 Encounters:  03/12/14 177 lb 12 oz (80.627 kg)  12/03/13 178 lb (80.74 kg)  10/02/13 180 lb (81.647 kg)    Physical Exam  Constitutional: She appears well-developed and well-nourished. No distress.  HENT:  Head: Normocephalic and atraumatic.  Right Ear: Hearing, tympanic membrane, external ear and ear canal normal.  Left Ear: Hearing, tympanic membrane, external ear and ear canal normal.  Nose: Mucosal edema (marked nasal mucosal inflammation/injection) and rhinorrhea present. Right sinus exhibits maxillary sinus tenderness. Right sinus exhibits no frontal sinus tenderness. Left sinus exhibits no maxillary sinus tenderness and no frontal sinus tenderness.  Mouth/Throat: Uvula is midline, oropharynx is clear and moist and mucous membranes are normal. No oropharyngeal exudate, posterior oropharyngeal edema, posterior oropharyngeal erythema or tonsillar abscesses.  Dry, edematous nasal alae  Eyes: Conjunctivae and EOM are normal. Pupils are equal, round, and reactive to light. No scleral icterus.  Neck: Normal range of motion. Neck supple.  Cardiovascular: Normal rate, regular rhythm, normal heart sounds and intact distal pulses.   No murmur heard. Pulmonary/Chest: Effort normal and breath sounds normal. No respiratory distress. She has no wheezes. She has no rales.  Lymphadenopathy:    She has no cervical adenopathy.  Skin: Skin is warm and dry. No rash noted.  Nursing note and vitals reviewed.     Assessment & Plan:   Problem List Items Addressed This Visit    Acute sinusitis -  Primary    Anticipate acute sinusitis. Given prior prolonged illness that started similarly, will treat aggressively with augmentin antibiotic 10d course. Treat cough with hydrocodone cough syrup. Flu swab negative today. Supportive care as per instructions Update if not improving with treatment.      Relevant Medications   HYDROCODONE-HOMATROPINE 5-1.5  MG/5ML PO SYRP   amoxicillin-clavulanate (AUGMENTIN) tablet 875-125 mg       Follow up plan: Return if symptoms worsen or fail to improve.

## 2014-03-12 NOTE — Assessment & Plan Note (Signed)
Anticipate acute sinusitis. Given prior prolonged illness that started similarly, will treat aggressively with augmentin antibiotic 10d course. Treat cough with hydrocodone cough syrup. Flu swab negative today. Supportive care as per instructions Update if not improving with treatment.

## 2014-03-12 NOTE — Progress Notes (Signed)
Pre visit review using our clinic review tool, if applicable. No additional management support is needed unless otherwise documented below in the visit note. 

## 2014-03-12 NOTE — Patient Instructions (Signed)
You have a sinus infection. Take medicine as prescribed: augmentin 10d course. Hydrocodone cough syrup for night time cough. Push fluids and plenty of rest. Nasal saline irrigation or neti pot to help drain sinuses. May use plain mucinex with plenty of fluid to help mobilize mucous. Please let us know if fever >101.5, trouble opening/closing mouth, difficulty swallowing, or worsening instead of improving as expected.

## 2014-08-04 ENCOUNTER — Encounter: Payer: Self-pay | Admitting: Primary Care

## 2014-08-04 ENCOUNTER — Ambulatory Visit (INDEPENDENT_AMBULATORY_CARE_PROVIDER_SITE_OTHER): Payer: No Typology Code available for payment source | Admitting: Primary Care

## 2014-08-04 ENCOUNTER — Ambulatory Visit (INDEPENDENT_AMBULATORY_CARE_PROVIDER_SITE_OTHER)
Admission: RE | Admit: 2014-08-04 | Discharge: 2014-08-04 | Disposition: A | Discharge status: Home / Self Care | Payer: No Typology Code available for payment source | Source: Ambulatory Visit | Attending: Primary Care | Admitting: Primary Care

## 2014-08-04 VITALS — BP 124/72 | HR 101 | Temp 99.1°F | Ht 64.0 in | Wt 177.8 lb

## 2014-08-04 DIAGNOSIS — R05 Cough: Secondary | ICD-10-CM | POA: Diagnosis not present

## 2014-08-04 DIAGNOSIS — R059 Cough, unspecified: Secondary | ICD-10-CM

## 2014-08-04 MED ORDER — HYDROCODONE-HOMATROPINE 5-1.5 MG/5ML PO SYRP: 5.0000 mL | ORAL_SOLUTION | Freq: Every evening | ORAL | Status: DC | PRN

## 2014-08-04 MED ORDER — BENZONATATE 200 MG PO CAPS: 200.0000 mg | ORAL_CAPSULE | Freq: Three times a day (TID) | ORAL | Status: DC | PRN

## 2014-08-04 NOTE — Progress Notes (Signed)
Pre visit review using our clinic review tool, if applicable. No additional management support is needed unless otherwise documented below in the visit note. 

## 2014-08-04 NOTE — Patient Instructions (Signed)
You may take Benzonatate capsules for cough. You may take 1 capsule by mouth three times daily as needed.  You may take the Hycodan at night as needed for cough and sleep.  Complete xray(s) prior to leaving today. I will contact you regarding your results.  It was nice meeting you!

## 2014-08-04 NOTE — Progress Notes (Signed)
Subjective:    Patient ID: Jasmine HatcherLori F Nolan, female    DOB: 07/23/1970, 44 y.o.   MRN: [id number]  HPI  Jasmine Nolan is a 44 year old female who presents today with a chief complaint of sore throat and cough. Her symptoms began Friday last week with a sore throat with some fatigue. Over the next several days she started experiencing headache, dry cough, increased fatigue, nasal congestion. She's been taking Delsym, Advil cold and sinus, Alkjl-selzer plus, aspirin, ibuprofen, and Benadryl without relief. Last summer she had pneumonia for 21 days.   Review of Systems  Constitutional: Positive for fever. Negative for chills.  HENT: Positive for congestion, rhinorrhea, sinus pressure and sore throat. Negative for ear pain.   Respiratory: Positive for cough. Negative for shortness of breath.   Cardiovascular: Negative for chest pain.  Gastrointestinal: Positive for nausea. Negative for vomiting.  Musculoskeletal: Positive for myalgias.  Neurological: Positive for headaches.       Past Medical History  Diagnosis Date  . Depression   . MVP (mitral valve prolapse)   . Arthritis   . Obesity   . Sinusitis   . Lateral epicondylitis of elbow   . Inguinal mass   . Internal hemorrhoids   . Gout   . IBS (irritable bowel syndrome)   . Pneumonia   . Anxiety     History   Social History  . Marital Status: Married    Spouse Name: N/A  . Number of Children: 1  . Years of Education: N/A   Occupational History  . medical billing    Social History Main Topics  . Smoking status: Former Smoker -- 0.50 packs/day for 20 years    Types: Cigarettes    Quit date: 01/17/2008  . Smokeless tobacco: Never Used     Comment: "off and on smoker"  . Alcohol Use: Yes     Comment: rarely  . Drug Use: No  . Sexual Activity: Not on file   Other Topics Concern  . Not on file   Social History Narrative    Past Surgical History  Procedure Laterality Date  . Cesarean section  2001  . Lumbar disc  surgery  June 2011  . Tubal ligation    . Endometrial ablation    . Cervical fusion  2011    Cram, 2 level  . Spine surgery      Family History  Problem Relation Age of Onset  . Colon cancer Father 259    died of colon CA  . Depression Mother   . Arthritis Mother     Allergies  Allergen Reactions  . Bactrim Nausea And Vomiting  . Dicyclomine Hcl Rash  . Tetanus Toxoids Other (See Comments)    fever    Current Outpatient Prescriptions on File Prior to Visit  Medication Sig Dispense Refill  . sertraline (ZOLOFT) 100 MG tablet Take 200 mg by mouth daily.     Marland Kitchen. albuterol (PROVENTIL HFA;VENTOLIN HFA) 108 (90 BASE) MCG/ACT inhaler Inhale 1-2 puffs into the lungs every 6 (six) hours as needed for wheezing or shortness of breath. (Patient not taking: Reported on 03/12/2014) 1 Inhaler 6  . budesonide-formoterol (SYMBICORT) 80-4.5 MCG/ACT inhaler Inhale 2 puffs into the lungs 2 (two) times daily. (Patient not taking: Reported on 03/12/2014) 1 Inhaler 6   No current facility-administered medications on file prior to visit.    BP 124/72 mmHg  Pulse 101  Temp(Src) 99.1 F (37.3 C) (Oral)  Ht 5\' 4"  (1.626  m)  Wt 177 lb 12.8 oz (80.65 kg)  BMI 30.50 kg/m2  SpO2 97%    Objective:   Physical Exam  Constitutional: She appears well-nourished. She appears ill.  HENT:  Right Ear: Tympanic membrane and ear canal normal.  Left Ear: Tympanic membrane and ear canal normal.  Nose: Right sinus exhibits maxillary sinus tenderness. Right sinus exhibits no frontal sinus tenderness. Left sinus exhibits maxillary sinus tenderness. Left sinus exhibits no frontal sinus tenderness.  Mouth/Throat: Posterior oropharyngeal erythema present. No oropharyngeal exudate or posterior oropharyngeal edema.  Neck: Neck supple.  Cardiovascular: Normal rate and regular rhythm.   Pulmonary/Chest: Effort normal and breath sounds normal.  Lymphadenopathy:    She has no cervical adenopathy.  Skin: Skin is warm and  dry.          Assessment & Plan:  URI:  Suspect viral at this point. Cough and sore throat for 4 days. Low grade fever during visit. Some erythema to pharynx, no exudate or injection. Chest xray today: Normal, no pneumonia. Supportive measures provided. Tessalon pearls for day cough, Hycodan at night. Will consider antibiotics if no improvement in symptoms by Friday this week. She is to call with an update.

## 2014-08-10 ENCOUNTER — Encounter: Payer: Self-pay | Admitting: Primary Care

## 2014-09-15 ENCOUNTER — Institutional Professional Consult (permissible substitution): Payer: No Typology Code available for payment source | Admitting: Pulmonary Disease

## 2014-12-20 ENCOUNTER — Inpatient Hospital Stay (HOSPITAL_COMMUNITY)
Admission: AD | Admit: 2014-12-20 | Discharge: 2014-12-20 | Disposition: A | Discharge status: Home / Self Care | Payer: Managed Care, Other (non HMO) | Source: Ambulatory Visit | Attending: Obstetrics and Gynecology | Admitting: Obstetrics and Gynecology

## 2014-12-20 ENCOUNTER — Encounter (HOSPITAL_COMMUNITY): Payer: Self-pay | Admitting: *Deleted

## 2014-12-20 DIAGNOSIS — I341 Nonrheumatic mitral (valve) prolapse: Secondary | ICD-10-CM | POA: Diagnosis not present

## 2014-12-20 DIAGNOSIS — Z87891 Personal history of nicotine dependence: Secondary | ICD-10-CM | POA: Insufficient documentation

## 2014-12-20 DIAGNOSIS — Z90710 Acquired absence of both cervix and uterus: Secondary | ICD-10-CM | POA: Insufficient documentation

## 2014-12-20 DIAGNOSIS — R102 Pelvic and perineal pain: Secondary | ICD-10-CM

## 2014-12-20 LAB — CBC WITH DIFFERENTIAL/PLATELET
BASOS ABS: 0 10*3/uL (ref 0.0–0.1)
Basophils Relative: 0 %
Eosinophils Absolute: 0.2 10*3/uL (ref 0.0–0.7)
Eosinophils Relative: 2 %
HEMATOCRIT: 38.1 % (ref 36.0–46.0)
Hemoglobin: 13 g/dL (ref 12.0–15.0)
LYMPHS ABS: 2.7 10*3/uL (ref 0.7–4.0)
LYMPHS PCT: 27 %
MCH: 28.4 pg (ref 26.0–34.0)
MCHC: 34.1 g/dL (ref 30.0–36.0)
MCV: 83.4 fL (ref 78.0–100.0)
MONO ABS: 0.5 10*3/uL (ref 0.1–1.0)
Monocytes Relative: 5 %
NEUTROS ABS: 6.5 10*3/uL (ref 1.7–7.7)
Neutrophils Relative %: 66 %
Platelets: 155 10*3/uL (ref 150–400)
RBC: 4.57 MIL/uL (ref 3.87–5.11)
RDW: 14.2 % (ref 11.5–15.5)
WBC: 10 10*3/uL (ref 4.0–10.5)

## 2014-12-20 LAB — URINALYSIS, ROUTINE W REFLEX MICROSCOPIC
BILIRUBIN URINE: NEGATIVE
GLUCOSE, UA: NEGATIVE mg/dL
Hgb urine dipstick: NEGATIVE
KETONES UR: NEGATIVE mg/dL
Leukocytes, UA: NEGATIVE
NITRITE: NEGATIVE
PH: 5.5 (ref 5.0–8.0)
Protein, ur: NEGATIVE mg/dL
Specific Gravity, Urine: 1.01 (ref 1.005–1.030)

## 2014-12-20 MED ORDER — HYDROMORPHONE HCL 1 MG/ML IJ SOLN
1.0000 mg | Freq: Once | INTRAMUSCULAR | Status: AC
  Administered 2014-12-20: 1 mg via INTRAMUSCULAR
  Filled 2014-12-20: qty 1

## 2014-12-20 MED ORDER — HYDROMORPHONE HCL 2 MG PO TABS: 2.0000 mg | ORAL_TABLET | ORAL | Status: DC | PRN

## 2014-12-20 NOTE — MAU Provider Note (Signed)
Chief Complaint:  Pelvic Pain   First Provider Initiated Contact with Patient 12/20/14 1657     HPI Jasmine Nolan is a 44 y.o. G1P0 who presents to maternity admissions reporting persistent pelvic pain.  States has a hysterectomy scheduled with Dr Rana SnareLowe for 01/01/15.  States it is for "something they saw in my uterus" She reports no vaginal bleeding, vaginal itching/burning, urinary symptoms, h/a, dizziness, n/v, or fever/chills.    RN Note:  Hurting. Has hysterectomy scheduled for 12/16. Saw Dr Rana SnareLowe this pass wk, on US something is in her uterus. Was given Vicodin and Anaprox for the pain, today they are not touching it.       Past Medical History: Past Medical History  Diagnosis Date  . Depression   . MVP (mitral valve prolapse)   . Arthritis   . Obesity   . Sinusitis   . Lateral epicondylitis of elbow   . Inguinal mass   . Internal hemorrhoids   . Gout   . IBS (irritable bowel syndrome)   . Pneumonia   . Anxiety     Past obstetric history: OB History  Gravida Para Term Preterm AB SAB TAB Ectopic Multiple Living  1         1    # Outcome Date GA Lbr Len/2nd Weight Sex Delivery Anes PTL Lv  1 Gravida               Past Surgical History: Past Surgical History  Procedure Laterality Date  . Cesarean section  2001  . Lumbar disc surgery  June 2011  . Tubal ligation    . Endometrial ablation    . Cervical fusion  2011    Cram, 2 level  . Spine surgery      Family History: Family History  Problem Relation Age of Onset  . Colon cancer Father 3159    died of colon CA  . Depression Mother   . Arthritis Mother     Social History: Social History  Substance Use Topics  . Smoking status: Former Smoker -- 0.50 packs/day for 20 years    Types: Cigarettes    Quit date: 01/17/2008  . Smokeless tobacco: Never Used     Comment: "off and on smoker"  . Alcohol Use: Yes     Comment: rarely    Allergies:  Allergies  Allergen Reactions  . Bactrim Nausea And  Vomiting  . Dicyclomine Hcl Rash  . Tetanus Toxoids Other (See Comments)    Reaction:  Fever     Meds:  Prescriptions prior to admission  Medication Sig Dispense Refill Last Dose  . Hydrocodone-Acetaminophen 5-300 MG TABS Take 1-2 tablets by mouth every 4 (four) hours as needed (for pain).    12/20/2014 at 1100  . naproxen sodium (ANAPROX) 550 MG tablet Take 550 mg by mouth every 8 (eight) hours as needed for mild pain.   12/20/2014 at 1100  . omeprazole (PRILOSEC) 20 MG capsule Take 20 mg by mouth at bedtime.   12/19/2014 at Unknown time  . sertraline (ZOLOFT) 100 MG tablet Take 200 mg by mouth at bedtime.    12/19/2014 at Unknown time  . benzonatate (TESSALON) 200 MG capsule Take 1 capsule (200 mg total) by mouth 3 (three) times daily as needed. (Patient not taking: Reported on 12/20/2014) 21 capsule 0   . HYDROcodone-homatropine (HYCODAN) 5-1.5 MG/5ML syrup Take 5 mLs by mouth at bedtime as needed. (Patient not taking: Reported on 12/20/2014) 50 mL 0  ROS:  Review of Systems  Constitutional: Positive for fatigue. Negative for fever and chills.  Respiratory: Negative for shortness of breath.   Gastrointestinal: Positive for abdominal pain. Negative for nausea, vomiting, diarrhea and constipation.  Genitourinary: Positive for pelvic pain. Negative for dysuria, flank pain, decreased urine volume, vaginal bleeding and vaginal discharge.  Musculoskeletal: Negative for back pain.   I have reviewed patient's Past Medical Hx, Surgical Hx, Family Hx, Social Hx, medications and allergies.   Physical Exam  Patient Vitals for the past 24 hrs:  BP Temp Temp src Pulse Resp  12/20/14 1508 139/91 mmHg 98.3 F (36.8 C) Oral 84 18   Constitutional: Well-developed, well-nourished female in no acute distress but appears uncomfortable  Cardiovascular: normal rate and rhythm, no ectopy audible, S1 & S2 heard, no murmur Respiratory: normal effort, no distress. Lungs CTAB with no wheezes or crackles GI:  Abd soft, non-tender.  Nondistended.  No rebound, No guarding.  Bowel Sounds audible  MS: Extremities nontender, no edema, normal ROM Neurologic: Alert and oriented x 4.   Grossly nonfocal. GU: Neg CVAT. Skin:  Warm and Dry Psych:  Affect appropriate.  Pelvic exam deferred Labs: Results for orders placed or performed during the hospital encounter of 12/20/14 (from the past 24 hour(s))  Urinalysis, Routine w reflex microscopic (not at [facility])     Status: None   Collection Time: 12/20/14  3:10 PM  Result Value Ref Range   Color, Urine YELLOW YELLOW   APPearance CLEAR CLEAR   Specific Gravity, Urine 1.010 1.005 - 1.030   pH 5.5 5.0 - 8.0   Glucose, UA NEGATIVE NEGATIVE mg/dL   Hgb urine dipstick NEGATIVE NEGATIVE   Bilirubin Urine NEGATIVE NEGATIVE   Ketones, ur NEGATIVE NEGATIVE mg/dL   Protein, ur NEGATIVE NEGATIVE mg/dL   Nitrite NEGATIVE NEGATIVE   Leukocytes, UA NEGATIVE NEGATIVE      CBC    Component Value Date/Time   WBC 10.0 12/20/2014 1722   RBC 4.57 12/20/2014 1722   HGB 13.0 12/20/2014 1722   HCT 38.1 12/20/2014 1722   PLT 155 12/20/2014 1722   MCV 83.4 12/20/2014 1722   MCH 28.4 12/20/2014 1722   MCHC 34.1 12/20/2014 1722   RDW 14.2 12/20/2014 1722   LYMPHSABS 2.7 12/20/2014 1722   MONOABS 0.5 12/20/2014 1722   EOSABS 0.2 12/20/2014 1722   BASOSABS 0.0 12/20/2014 1722   Imaging:  No results found.  MAU Course/MDM: I have ordered labs as follows: UA Imaging ordered: None Results reviewed.   Consult Dr Arelia Sneddon with presentation, symptoms, exam findings. He recommends medicating her for pain then discharge  home with Rx for Dilaudid. Pt is to call Dr Rana Snare in AM Treatments in MAU included Pain med with good relief   Pt stable at time of discharge.  Assessment: Pelvic Pain  Plan: Discharge home Recommend comfort measures   Do not take vicodin with dilaudid. Rx sent for Dilaudid for pain     Medication List    ASK your doctor about these medications         benzonatate 200 MG capsule  Commonly known as:  TESSALON  Take 1 capsule (200 mg total) by mouth 3 (three) times daily as needed.     Hydrocodone-Acetaminophen 5-300 MG Tabs  Take 1-2 tablets by mouth every 4 (four) hours as needed (for pain).     HYDROcodone-homatropine 5-1.5 MG/5ML syrup  Commonly known as:  HYCODAN  Take 5 mLs by mouth at bedtime as needed.  naproxen sodium 550 MG tablet  Commonly known as:  ANAPROX  Take 550 mg by mouth every 8 (eight) hours as needed for mild pain.     omeprazole 20 MG capsule  Commonly known as:  PRILOSEC  Take 20 mg by mouth at bedtime.     ZOLOFT 100 MG tablet  Generic drug:  sertraline  Take 200 mg by mouth at bedtime.       Encouraged to return here or to other Urgent Care/ED if she develops worsening of symptoms, increase in pain, fever, or other concerning symptoms.   Wynelle Bourgeois CNM, MSN Certified Nurse-Midwife 12/20/2014 5:21 PM

## 2014-12-20 NOTE — MAU Note (Signed)
Hurting.  Has hysterectomy scheduled for 12/16.  Saw Dr Rana SnareLowe this pass wk, on US something is in her uterus.  Was given Vicodin and Anaprox for the pain, today they are not touching it.

## 2014-12-20 NOTE — Discharge Instructions (Signed)

## 2014-12-25 ENCOUNTER — Other Ambulatory Visit: Payer: Self-pay | Admitting: Obstetrics and Gynecology

## 2015-02-26 ENCOUNTER — Other Ambulatory Visit: Payer: Self-pay | Admitting: Obstetrics and Gynecology

## 2015-02-26 DIAGNOSIS — R928 Other abnormal and inconclusive findings on diagnostic imaging of breast: Secondary | ICD-10-CM

## 2015-03-05 ENCOUNTER — Ambulatory Visit
Admission: RE | Admit: 2015-03-05 | Discharge: 2015-03-05 | Disposition: A | Discharge status: Home / Self Care | Payer: Managed Care, Other (non HMO) | Source: Ambulatory Visit | Attending: Obstetrics and Gynecology | Admitting: Obstetrics and Gynecology

## 2015-03-05 DIAGNOSIS — R928 Other abnormal and inconclusive findings on diagnostic imaging of breast: Secondary | ICD-10-CM

## 2015-05-01 ENCOUNTER — Ambulatory Visit (INDEPENDENT_AMBULATORY_CARE_PROVIDER_SITE_OTHER): Payer: Managed Care, Other (non HMO) | Admitting: Family Medicine

## 2015-05-01 VITALS — BP 110/88 | HR 97 | Temp 97.9°F | Resp 17 | Ht 64.0 in | Wt 174.6 lb

## 2015-05-01 DIAGNOSIS — H9201 Otalgia, right ear: Secondary | ICD-10-CM | POA: Diagnosis not present

## 2015-05-01 DIAGNOSIS — J029 Acute pharyngitis, unspecified: Secondary | ICD-10-CM | POA: Diagnosis not present

## 2015-05-01 DIAGNOSIS — J019 Acute sinusitis, unspecified: Secondary | ICD-10-CM

## 2015-05-01 MED ORDER — IPRATROPIUM BROMIDE 0.03 % NA SOLN: 2.0000 | Freq: Two times a day (BID) | NASAL | Status: DC

## 2015-05-01 MED ORDER — AMOXICILLIN-POT CLAVULANATE 875-125 MG PO TABS: 1.0000 | ORAL_TABLET | Freq: Two times a day (BID) | ORAL | Status: DC

## 2015-05-01 NOTE — Patient Instructions (Addendum)
Continue to drink plenty of fluids to stay well hydrated, and get enough rest  Take Augmentin 1 twice daily for infection  Use the Atrovent nasal spray 2 sprays each nostril up to 4 times daily for head congestion  It can take a plain antihistamine such as Claritin or Allegra or Zyrtec without the decongestant since you're using the nose spray.  Take Tylenol or ibuprofen if needed for pain. There is no obvious ear infection, but you're going to be on an antibiotic anyhow for the sinuses so if there is a low-grade infection that would take care of that. Hopefully using the decongestant spray will help in the opening of the eustachian tubes and your symptoms should improve.  Return if worse    IF you received an x-ray today, you will receive an invoice from [facility]. Please contact [facility] at [phone] with questions or concerns regarding your invoice.   IF you received labwork today, you will receive an invoice from [facility]. Please contact [facility] at [phone] with questions or concerns regarding your invoice.   Our billing staff will not be able to assist you with questions regarding bills from these companies.  You will be contacted with the lab results as soon as they are available. The fastest way to get your results is to activate your My Chart account. Instructions are located on the last page of this paperwork. If you have not heard from us regarding the results in 2 weeks, please contact this office.

## 2015-05-01 NOTE — Progress Notes (Signed)
Patient ID: Marlane HatcherLori F Nolan, female    DOB: 1970/08/23  Age: 45 y.o. MRN: [id number]  Chief Complaint  Patient presents with  . Sore Throat    X 1.5 weeks  . Otalgia    Right, X 1.5 weeks  . Cough    X last night    Subjective:   45 year old lady who lives in [place] She has about a 10 day history of an upper respiratory infection. She has had a sore throat and head congestion. The throat is difficult to swallow. She has pain into the right ear also. She has not been running fevers. She has some cough. She is blowing some yellow mucus from her nose. She has continued to work except for the fact that she was scheduled out of work the last 3 days. She does not smoke. Does not have a history of major annual seasonal allergies. There is a Jasmine Nolan who has not been sick.   Current allergies, medications, problem list, past/family and social histories reviewed.  Objective:  BP 110/88 mmHg  Pulse 97  Temp(Src) 97.9 F (36.6 C) (Oral)  Resp 17  Ht 5\' 4"  (1.626 m)  Wt 174 lb 9.6 oz (79.198 kg)  BMI 29.96 kg/m2  SpO2 98%  No major distress. Pleasant alert young lady. TMs appear normal. Nose a little congested. Mild sinus tenderness. Her throat is only minimally erythematous. A little edema of the uvula. Her neck supple without major nodes. Chest clear to auscultation. Heart regular without murmurs.  Assessment & Plan:   Assessment: No diagnosis found.    Plan: 10 days of symptoms, probably has a persistent low-grade sinusitis feeding her throat problems and cough. Ears look okay, which is consistent with the eustachian tubes primarily being plugged up causing pain and/or pain referred from the throat.  No orders of the defined types were placed in this encounter.    No orders of the defined types were placed in this encounter.         Patient Instructions       IF you received an x-ray today, you will receive an invoice from [facility]. Please  contact [facility] at [phone] with questions or concerns regarding your invoice.   IF you received labwork today, you will receive an invoice from [facility]. Please contact [facility] at [phone] with questions or concerns regarding your invoice.   Our billing staff will not be able to assist you with questions regarding bills from these companies.  You will be contacted with the lab results as soon as they are available. The fastest way to get your results is to activate your My Chart account. Instructions are located on the last page of this paperwork. If you have not heard from us regarding the results in 2 weeks, please contact this office.         No Follow-up on file.   Jasmine Mayfield, MD 05/01/2015

## 2015-05-05 ENCOUNTER — Telehealth: Payer: Self-pay

## 2015-05-05 MED ORDER — FLUCONAZOLE 150 MG PO TABS: 150.0000 mg | ORAL_TABLET | Freq: Once | ORAL | Status: DC

## 2015-05-05 NOTE — Telephone Encounter (Signed)
Pt needs an rx for diflucan   Best number [phone]

## 2015-05-05 NOTE — Telephone Encounter (Signed)
Rx sent 

## 2015-05-19 ENCOUNTER — Encounter: Payer: Self-pay | Admitting: Family Medicine

## 2015-05-19 ENCOUNTER — Ambulatory Visit (INDEPENDENT_AMBULATORY_CARE_PROVIDER_SITE_OTHER): Payer: Managed Care, Other (non HMO) | Admitting: Family Medicine

## 2015-05-19 VITALS — BP 136/88 | HR 78 | Temp 98.6°F

## 2015-05-19 DIAGNOSIS — J0100 Acute maxillary sinusitis, unspecified: Secondary | ICD-10-CM | POA: Diagnosis not present

## 2015-05-19 DIAGNOSIS — J029 Acute pharyngitis, unspecified: Secondary | ICD-10-CM

## 2015-05-19 LAB — POCT RAPID STREP A (OFFICE): Rapid Strep A Screen: NEGATIVE

## 2015-05-19 MED ORDER — DOXYCYCLINE HYCLATE 100 MG PO TABS: 100.0000 mg | ORAL_TABLET | Freq: Two times a day (BID) | ORAL | Status: DC

## 2015-05-19 MED ORDER — FLUTICASONE PROPIONATE 50 MCG/ACT NA SUSP: 2.0000 | Freq: Every day | NASAL | Status: DC

## 2015-05-19 MED ORDER — FLUCONAZOLE 150 MG PO TABS: 150.0000 mg | ORAL_TABLET | Freq: Once | ORAL | Status: DC

## 2015-05-19 NOTE — Progress Notes (Signed)
Pre visit review using our clinic review tool, if applicable. No additional management support is needed unless otherwise documented below in the visit note.  Sx started about 5 weeks ago, when pollen counts went up.  More and more ST, R ear pain, HA, eye watering, she attributed to allergy/pollen.  [facility]  She got some better with augmentin and atrovent nasal spray.  Then worse in the meantime again, in the last week.  Sick exposures at work.  Eyes are irritated, ST, some cough, no wheeze.  No fevers.  Facial pain.  R ear pain.  Fatigued.  Upper tooth pain.    Meds, vitals, and allergies reviewed.   ROS: Per HPI unless specifically indicated in ROS section   GEN: nad, alert and oriented HEENT: mucous membranes moist, tm w/o erythema, nasal exam w/o erythema, clear discharge noted,  OP with cobblestoning, R max ttp NECK: supple w/o LA CV: rrr.   PULM: ctab, no inc wob

## 2015-05-19 NOTE — Patient Instructions (Signed)
Start doxycycline- sun caution.  Use diflucan if needed.  Add on flonase.  Take care.  Rest and fluids.  Update us as needed.   Glad to see you.

## 2015-05-19 NOTE — Assessment & Plan Note (Signed)
Rest fluids, doxy flonase, use diflucan if yeast infection on doxy.  F/u prn.  She agrees.

## 2015-05-27 ENCOUNTER — Encounter: Payer: Self-pay | Admitting: Family Medicine

## 2015-05-28 ENCOUNTER — Encounter: Payer: Self-pay | Admitting: Family Medicine

## 2015-05-28 MED ORDER — FIRST-DUKES MOUTHWASH MT SUSP: 5.0000 mL | Freq: Three times a day (TID) | OROMUCOSAL | Status: DC | PRN

## 2015-05-28 NOTE — Telephone Encounter (Signed)
You should be able to go under chart review to see it.  It was probably originally sent to Dr. Para Marchuncan but he is out of the office until next week.

## 2015-06-30 ENCOUNTER — Encounter: Payer: Self-pay | Admitting: Primary Care

## 2015-06-30 ENCOUNTER — Ambulatory Visit (INDEPENDENT_AMBULATORY_CARE_PROVIDER_SITE_OTHER): Payer: Managed Care, Other (non HMO) | Admitting: Primary Care

## 2015-06-30 VITALS — BP 122/76 | HR 94 | Temp 98.6°F | Ht 64.0 in | Wt 174.8 lb

## 2015-06-30 DIAGNOSIS — K140 Glossitis: Secondary | ICD-10-CM | POA: Diagnosis not present

## 2015-06-30 MED ORDER — MAGIC MOUTHWASH W/LIDOCAINE: 15.0000 mL | Freq: Three times a day (TID) | ORAL | Status: DC | PRN

## 2015-06-30 NOTE — Progress Notes (Signed)
Pre visit review using our clinic review tool, if applicable. No additional management support is needed unless otherwise documented below in the visit note. 

## 2015-06-30 NOTE — Progress Notes (Signed)
Subjective:    Patient ID: Jasmine HatcherLori F Nolan, female    DOB: 1970/01/30, 45 y.o.   MRN: [id number]  HPI  Jasmine Nolan is a 45 year old female who presents today with a chief complaint of tongue discomfort. Her symptoms began after taking Doxycycline for sinusitis. Her mouth feels dry and she has soreness to the left lateral side of her tongue. She was provided with a prescription for [employer]'s Magic Mouthwash in mid May 2017, with temporary improvement until the bottle was empty. Denies cough, sore throat, fevers, new foods, new mouth products, swelling, tightness to her throat.  Review of Systems  Constitutional: Negative for fever and appetite change.  HENT: Negative for congestion, sore throat and trouble swallowing.        Tongue ulcer  Respiratory: Negative for shortness of breath.        Past Medical History  Diagnosis Date  . Depression   . MVP (mitral valve prolapse)   . Arthritis   . Obesity   . Sinusitis   . Lateral epicondylitis of elbow   . Inguinal mass   . Internal hemorrhoids   . Gout   . IBS (irritable bowel syndrome)   . Pneumonia   . Anxiety      Social History   Social History  . Marital Status: Divorced    Spouse Name: N/A  . Number of Children: 1  . Years of Education: N/A   Occupational History  . medical billing    Social History Main Topics  . Smoking status: Former Smoker -- 0.50 packs/day for 20 years    Types: Cigarettes    Quit date: 01/17/2008  . Smokeless tobacco: Never Used     Comment: "off and on smoker"  . Alcohol Use: 0.0 oz/week    0 Standard drinks or equivalent per week     Comment: rarely  . Drug Use: No  . Sexual Activity: Not on file   Other Topics Concern  . Not on file   Social History Narrative    Past Surgical History  Procedure Laterality Date  . Cesarean section  2001  . Lumbar disc surgery  June 2011  . Tubal ligation    . Endometrial ablation    . Cervical fusion  2011    Cram, 2 level  . Spine surgery       Family History  Problem Relation Age of Onset  . Colon cancer Father 859    died of colon CA  . Depression Mother   . Arthritis Mother     Allergies  Allergen Reactions  . Bactrim Nausea And Vomiting  . Dicyclomine Hcl Rash  . Tetanus Toxoids Other (See Comments)    Reaction:  Fever     Current Outpatient Prescriptions on File Prior to Visit  Medication Sig Dispense Refill  . omeprazole (PRILOSEC) 20 MG capsule Take 20 mg by mouth at bedtime. Reported on 05/01/2015    . sertraline (ZOLOFT) 100 MG tablet Take 200 mg by mouth at bedtime.     . Diphenhyd-Hydrocort-Nystatin (FIRST-DUKES MOUTHWASH) SUSP Use as directed 5 mLs in the mouth or throat 3 (three) times daily as needed (mouth pain). (Patient not taking: Reported on 06/30/2015) 120 mL 0  . fluticasone (FLONASE) 50 MCG/ACT nasal spray Place 2 sprays into both nostrils daily. (Patient not taking: Reported on 06/30/2015) 16 g 1  . ipratropium (ATROVENT) 0.03 % nasal spray Place 2 sprays into both nostrils 2 (two) times daily. (Patient not taking:  Reported on 06/30/2015) 30 mL 0   No current facility-administered medications on file prior to visit.    BP 122/76 mmHg  Pulse 94  Temp(Src) 98.6 F (37 C) (Oral)  Ht  (1.626 m)  Wt 174 lb 12.8 oz (79.289 kg)  BMI 29.99 kg/m2  SpO2 96%    Objective:   Physical Exam  Constitutional: She appears well-nourished.  HENT:  Nose: Nose normal.  Mouth/Throat: Oropharynx is clear and moist.  Mild ulcer/sore to left lateral side of tongue, otherwise unremarkable.  Neck: Neck supple.  Cardiovascular: Normal rate and regular rhythm.   Pulmonary/Chest: Effort normal and breath sounds normal.  Skin: Skin is warm and dry.          Assessment & Plan:  Tongue Ulcer:  Small, mild-moderate ulcer to left lateral tongue. ENT exam otherwise unremarkable. Temporary improvement with Duke's mouthwash. No changes in foods, oral products, etc. Rx for Kenalog in Orabase Gel BID x 1  week. Return precautions provided.

## 2015-06-30 NOTE — Patient Instructions (Signed)
Apply the Kenalog in Orabase gel three times daily to ulcer for 1-2 weeks.  You may use the Magic Mouthwash three times daily as needed for pain.  Please e-mail or call me if no improvement in 2 weeks.  It was a pleasure to see you today!

## 2015-07-01 ENCOUNTER — Telehealth: Payer: Self-pay

## 2015-07-01 NOTE — Telephone Encounter (Signed)
[facility] [address] left v/m requesting how much lidocaine in Dukes mouthwash; ex. 1:1 ratio or what is needed. [facility] request cb.

## 2015-07-01 NOTE — Telephone Encounter (Signed)
Called [facility] and notified them of Kate's comments.

## 2015-07-01 NOTE — Telephone Encounter (Signed)
1:1 ratio is fine. Thanks.

## 2015-08-26 ENCOUNTER — Other Ambulatory Visit: Payer: Self-pay | Admitting: Obstetrics and Gynecology

## 2015-08-26 DIAGNOSIS — R921 Mammographic calcification found on diagnostic imaging of breast: Secondary | ICD-10-CM

## 2015-09-01 ENCOUNTER — Other Ambulatory Visit: Payer: Self-pay | Admitting: Obstetrics and Gynecology

## 2015-09-01 ENCOUNTER — Ambulatory Visit
Admission: RE | Admit: 2015-09-01 | Discharge: 2015-09-01 | Disposition: A | Discharge status: Home / Self Care | Payer: Managed Care, Other (non HMO) | Source: Ambulatory Visit | Attending: Obstetrics and Gynecology | Admitting: Obstetrics and Gynecology

## 2015-09-01 DIAGNOSIS — N6320 Unspecified lump in the left breast, unspecified quadrant: Secondary | ICD-10-CM

## 2015-09-01 DIAGNOSIS — R921 Mammographic calcification found on diagnostic imaging of breast: Secondary | ICD-10-CM

## 2015-11-30 ENCOUNTER — Encounter: Payer: Self-pay | Admitting: Family Medicine

## 2015-11-30 ENCOUNTER — Ambulatory Visit (INDEPENDENT_AMBULATORY_CARE_PROVIDER_SITE_OTHER): Payer: 59 | Admitting: Family Medicine

## 2015-11-30 VITALS — BP 126/78 | HR 109 | Temp 100.0°F | Wt 179.0 lb

## 2015-11-30 DIAGNOSIS — J209 Acute bronchitis, unspecified: Secondary | ICD-10-CM

## 2015-11-30 MED ORDER — HYDROCOD POLST-CPM POLST ER 10-8 MG/5ML PO SUER: 5.0000 mL | Freq: Two times a day (BID) | ORAL | 0 refills | Status: DC | PRN

## 2015-11-30 MED ORDER — DOXYCYCLINE HYCLATE 100 MG PO TABS: 100.0000 mg | ORAL_TABLET | Freq: Two times a day (BID) | ORAL | 0 refills | Status: DC

## 2015-11-30 NOTE — Patient Instructions (Signed)
Great to see you. Continue your inhalers.  Take doxycyline 100 mg twice daily x 10 days.  Tussionex as needed ( up to twice daily) but please drive after taking it.

## 2015-11-30 NOTE — Progress Notes (Signed)
SUBJECTIVE:  Jasmine HatcherLori F [place] is a 45 y.o. female who complains of coryza, congestion, productive cough and fever for 5 days. She denies a history of anorexia and chest pain and admits to a history of asthma. Patient denies smoke cigarettes.  Has been taking Symbicort, albuterol, flonase, Ibuprofen and tylenol. Tmax 101 with Tylenol and Ibuprofen.   Allergic to Bactrim only.  Current Outpatient Prescriptions on File Prior to Visit  Medication Sig Dispense Refill  . fluticasone (FLONASE) 50 MCG/ACT nasal spray Place 2 sprays into both nostrils daily. 16 g 1  . omeprazole (PRILOSEC) 20 MG capsule Take 20 mg by mouth at bedtime. Reported on 05/01/2015    . sertraline (ZOLOFT) 100 MG tablet Take 200 mg by mouth at bedtime.      No current facility-administered medications on file prior to visit.     Allergies  Allergen Reactions  . Bactrim Nausea And Vomiting  . Dicyclomine Hcl Rash  . Tetanus Toxoids Other (See Comments)    Reaction:  Fever     Past Medical History:  Diagnosis Date  . Anxiety   . Arthritis   . Depression   . Gout   . IBS (irritable bowel syndrome)   . Inguinal mass   . Internal hemorrhoids   . Lateral epicondylitis of elbow   . MVP (mitral valve prolapse)   . Obesity   . Pneumonia   . Sinusitis     Past Surgical History:  Procedure Laterality Date  . CERVICAL FUSION  2011   Cram, 2 level  . CESAREAN SECTION  2001  . ENDOMETRIAL ABLATION    . LUMBAR DISC SURGERY  June 2011  . SPINE SURGERY    . TUBAL LIGATION      Family History  Problem Relation Age of Onset  . Colon cancer Father 5159    died of colon CA  . Depression Mother   . Arthritis Mother     Social History   Social History  . Marital status: Divorced    Spouse name: N/A  . Number of children: 1  . Years of education: N/A   Occupational History  . medical billing [employer]   Social History Main Topics  . Smoking status: Former Smoker    Packs/day: 0.50    Years: 20.00   Types: Cigarettes    Quit date: 01/17/2008  . Smokeless tobacco: Never Used     Comment: "off and on smoker"  . Alcohol use 0.0 oz/week     Comment: rarely  . Drug use: No  . Sexual activity: Not on file   Other Topics Concern  . Not on file   Social History Narrative  . No narrative on file   The PMH, PSH, Social History, Family History, Medications, and allergies [facility], and have been updated if relevant.  OBJECTIVE: BP 126/78   Pulse (!) 109   Temp 100 F (37.8 C) (Oral)   Wt 179 lb (81.2 kg)   SpO2 97%   BMI 30.73 kg/m   She appears well, vital signs are as noted. Ears normal.  Throat and pharynx normal.  Neck supple. No adenopathy in the neck. Nose is congested. Sinuses non tender. Scattered wheezes  ASSESSMENT:  bronchitis  PLAN: Given duration and progression of symptoms, will treat for bacterial process- .doxycyline twice daily x 10 days, Tussionex as needed for cough- discussed sedation precautions.  Symptomatic therapy suggested: push fluids, rest and return office visit prn if symptoms persist  or worsen.  Call or return to clinic prn if these symptoms worsen or fail to improve as anticipated.

## 2016-01-26 ENCOUNTER — Encounter: Payer: Self-pay | Admitting: Internal Medicine

## 2016-02-01 ENCOUNTER — Other Ambulatory Visit: Payer: Self-pay | Admitting: Obstetrics and Gynecology

## 2016-02-01 DIAGNOSIS — N6310 Unspecified lump in the right breast, unspecified quadrant: Secondary | ICD-10-CM

## 2016-02-15 ENCOUNTER — Telehealth: Payer: Self-pay | Admitting: Family Medicine

## 2016-02-15 NOTE — Telephone Encounter (Signed)
Patient Name: Jasmine [place]  DOB: 29-Jul-1970    Initial Comment Caller says that she has a pink eye in both eyes. She has redness, discharge and the left eye is itching. Wants to get something called in since they are booked.    Nurse Assessment  Nurse: Annye Englisharmon, RN, Angelique Blonderenise Date/Time (Eastern Time): 02/15/2016 1:45:17 PM  Confirm and document reason for call. If symptomatic, describe symptoms. ---Caller says that she has a pink eye in both eyes. She has redness, discharge and the left eye is itching. Wants to get something called in since they are booked and the avail [facility]'s are 45 mins from her home.  Does the patient have any new or worsening symptoms? ---Yes  Will a triage be completed? ---Yes  Related visit to physician within the last 2 weeks? ---No  Does the PT have any chronic conditions? (i.e. diabetes, asthma, etc.) ---No  Is the patient pregnant or possibly pregnant? (Ask all females between the ages of 4812-55) ---No  Is this a behavioral health or substance abuse call? ---No     Guidelines    Guideline Title Affirmed Question Affirmed Notes  Eye - Pus or Discharge [1] Eye with yellow/green discharge or eyelashes stick together AND [2] NO PCP standing order to call in antibiotic eye drops    Final Disposition User   Call PCP within 24 Hours Carmon, RN, Angelique BlonderDenise    Comments  Advised will call the in office nurse to make aware of request for abx eye gtts and reason being UCC too far to drive. Allergies: Tetanus, Bentyl, Bactrim [facility] [address], [phone]  Advised if she does not hear back from the MD or nurse today or tomorrow, [facility]   Referrals  REFERRED TO PCP OFFICE   Disagree/Comply: Comply

## 2016-02-15 NOTE — Telephone Encounter (Signed)
Please call to check on pt.  Did she go to UC?

## 2016-02-15 NOTE — Telephone Encounter (Signed)
Patient called and said she didn't go to Urgent Care because it's 45 minutes away and she doesn't want to be exposed to the flu.

## 2016-02-16 ENCOUNTER — Encounter: Payer: Self-pay | Admitting: Family Medicine

## 2016-02-16 MED ORDER — CIPROFLOXACIN HCL 0.3 % OP SOLN: OPHTHALMIC | 0 refills | Status: DC

## 2016-02-16 NOTE — Telephone Encounter (Signed)
Eye drops sent to pharmacy on file.

## 2016-02-24 DIAGNOSIS — Z01419 Encounter for gynecological examination (general) (routine) without abnormal findings: Secondary | ICD-10-CM | POA: Diagnosis not present

## 2016-03-06 ENCOUNTER — Ambulatory Visit
Admission: RE | Admit: 2016-03-06 | Discharge: 2016-03-06 | Disposition: A | Discharge status: Home / Self Care | Payer: 59 | Source: Ambulatory Visit | Attending: Obstetrics and Gynecology | Admitting: Obstetrics and Gynecology

## 2016-03-06 DIAGNOSIS — N6310 Unspecified lump in the right breast, unspecified quadrant: Secondary | ICD-10-CM

## 2016-03-06 DIAGNOSIS — R920 Mammographic microcalcification found on diagnostic imaging of breast: Secondary | ICD-10-CM | POA: Diagnosis not present

## 2016-03-15 DIAGNOSIS — H04123 Dry eye syndrome of bilateral lacrimal glands: Secondary | ICD-10-CM | POA: Diagnosis not present

## 2016-04-04 ENCOUNTER — Other Ambulatory Visit: Payer: Self-pay | Admitting: Family Medicine

## 2016-04-04 ENCOUNTER — Encounter: Payer: Self-pay | Admitting: Family Medicine

## 2016-04-04 DIAGNOSIS — I341 Nonrheumatic mitral (valve) prolapse: Secondary | ICD-10-CM

## 2016-04-13 ENCOUNTER — Ambulatory Visit (INDEPENDENT_AMBULATORY_CARE_PROVIDER_SITE_OTHER): Payer: 59 | Admitting: Internal Medicine

## 2016-04-13 ENCOUNTER — Other Ambulatory Visit
Admission: RE | Admit: 2016-04-13 | Discharge: 2016-04-13 | Disposition: A | Discharge status: Home / Self Care | Payer: 59 | Source: Ambulatory Visit | Attending: Internal Medicine | Admitting: Internal Medicine

## 2016-04-13 ENCOUNTER — Encounter: Payer: Self-pay | Admitting: Internal Medicine

## 2016-04-13 VITALS — BP 134/94 | HR 77 | Ht 63.0 in | Wt 175.0 lb

## 2016-04-13 DIAGNOSIS — R002 Palpitations: Secondary | ICD-10-CM | POA: Diagnosis not present

## 2016-04-13 DIAGNOSIS — R0789 Other chest pain: Secondary | ICD-10-CM | POA: Diagnosis not present

## 2016-04-13 DIAGNOSIS — I493 Ventricular premature depolarization: Secondary | ICD-10-CM

## 2016-04-13 DIAGNOSIS — R079 Chest pain, unspecified: Secondary | ICD-10-CM | POA: Diagnosis not present

## 2016-04-13 DIAGNOSIS — R030 Elevated blood-pressure reading, without diagnosis of hypertension: Secondary | ICD-10-CM | POA: Diagnosis not present

## 2016-04-13 LAB — BASIC METABOLIC PANEL
ANION GAP: 7 (ref 5–15)
BUN: 16 mg/dL (ref 6–20)
CHLORIDE: 104 mmol/L (ref 101–111)
CO2: 26 mmol/L (ref 22–32)
CREATININE: 1.02 mg/dL — AB (ref 0.44–1.00)
Calcium: 9.1 mg/dL (ref 8.9–10.3)
GFR calc non Af Amer: >60 mL/min (ref >60)
GLUCOSE: 92 mg/dL (ref 65–99)
Potassium: 4 mmol/L (ref 3.5–5.1)
Sodium: 137 mmol/L (ref 135–145)

## 2016-04-13 LAB — MAGNESIUM: Magnesium: 1.9 mg/dL (ref 1.7–2.4)

## 2016-04-13 LAB — TSH: TSH: 1.945 u[IU]/mL (ref 0.350–4.500)

## 2016-04-13 NOTE — Progress Notes (Signed)
New Outpatient Visit Date: 04/13/2016  Referring Provider: Dianne Dunalia M Aron, MD [address] [place], [place] [id number]  Chief Complaint: palpitations and chest pain  HPI:  [name] is seen today for evaluation of palpitations and chest pain at the request of Dr. Dayton MartesAron. She is a 46 y.o. year-old female with history of mitral valve prolapse (seen by Dr. Swazil[place]Jordan in the remote past), irritable bowel syndrome, depression, anxiety, gout, and obesity. The patient feels that her chest pain and palpitations have worsened over the last few months. She describes her chest pain as a dull ache below the sternum that radiates to the back. It can occur randomly and is not associated with exertion or other activities. It typically lasts 30-120 seconds and occurs once or twice a day. It is maximal intensity is 4/10. She feels as though her breathing might be a little bit faster while she is having the chest pain, though she does not feel significantly short of breath.  Palpitations also seemed to be happening more often. She feels like her "heart is out of rhythm." The palpitations come and go throughout the day and are a little more frequent than when she was seen previously by Dr. Swazil[place]Jordan more than 10 years ago. She has some associated lightheadedness but has not passed out or fallen. The palpitations do not necessarily: Side with the aforementioned chest pain. They last for up to a minute at a time and are now occurring a few days a week. She denies orthopnea, PND, leg swelling, and claudication. She drinks 2 cups of caffeinated coffee per day.  --------------------------------------------------------------------------------------------------  Cardiovascular History & Procedures: Cardiovascular Problems:  Mitral valve prolapse  Palpitations  Atypical chest pain  Risk Factors:  Obesity  Cath/PCI:  None  CV Surgery:  None  EP Procedures and Devices:  Patient reports wearing Holter monitor in the  remote past and thinks that showed PVCs.  Non-Invasive Evaluation(s):  None available; patient reports history of mitral valve prolapse on remote echoes  Recent CV Pertinent Labs: Lab Results  Component Value Date   CHOL 220 (H) 04/26/2012   HDL 30.20 (L) 04/26/2012   LDLDIRECT 151.7 04/26/2012   TRIG 238.0 (H) 04/26/2012   CHOLHDL 7 04/26/2012   K 3.9 04/26/2012   BUN 13 04/26/2012   CREATININE 1.1 04/26/2012    --------------------------------------------------------------------------------------------------  Past Medical History:  Diagnosis Date  . Anxiety   . Arthritis   . Depression   . Gout   . IBS (irritable bowel syndrome)   . Inguinal mass   . Internal hemorrhoids   . Lateral epicondylitis of elbow   . MVP (mitral valve prolapse)   . Obesity   . Pneumonia   . Sinusitis     Past Surgical History:  Procedure Laterality Date  . CERVICAL FUSION  2011   Cram, 2 level  . CESAREAN SECTION  2001  . ENDOMETRIAL ABLATION    . LUMBAR DISC SURGERY  June 2011  . SPINE SURGERY    . TUBAL LIGATION      Outpatient Encounter Prescriptions as of 04/13/2016  Medication Sig  . chlorpheniramine-HYDROcodone (TUSSIONEX PENNKINETIC ER) 10-8 MG/5ML SUER Take 5 mLs by mouth every 12 (twelve) hours as needed.  . ciprofloxacin (CILOXAN) 0.3 % ophthalmic solution 1 drop, every 4 hours, while awake for 7 days  . doxycycline (VIBRA-TABS) 100 MG tablet Take 1 tablet (100 mg total) by mouth 2 (two) times daily.  . fluticasone (FLONASE) 50 MCG/ACT nasal spray Place 2  sprays into both nostrils daily.  Marland Kitchen omeprazole (PRILOSEC) 20 MG capsule Take 20 mg by mouth at bedtime. Reported on 05/01/2015  . sertraline (ZOLOFT) 100 MG tablet Take 200 mg by mouth at bedtime.    No facility-administered encounter medications on file as of 04/13/2016.     Allergies: Bactrim; Dicyclomine hcl; and Tetanus toxoids  Social History   Social History  . Marital status: Divorced    Spouse name: N/A  .  Number of children: 1  . Years of education: N/A   Occupational History  . medical billing [employer]   Social History Main Topics  . Smoking status: Former Smoker    Packs/day: 0.50    Years: 20.00    Types: Cigarettes    Quit date: 01/17/2008  . Smokeless tobacco: Never Used     Comment: "off and on smoker"  . Alcohol use 0.0 oz/week     Comment: rarely  . Drug use: No  . Sexual activity: Not on file   Other Topics Concern  . Not on file   Social History Narrative  . No narrative on file    Family History  Problem Relation Age of Onset  . Colon cancer Father 22    died of colon CA  . Depression Mother   . Arthritis Mother   . Heart disease Mother     Enlarged heart  . Heart murmur Brother   . Arrhythmia Maternal Grandmother     Review of Systems: A 12-system review of systems was performed and was negative except as noted in the HPI.  --------------------------------------------------------------------------------------------------  Physical Exam: BP (!) 134/94 (BP Location: Right Arm, Patient Position: Sitting, Cuff Size: Normal)   Pulse 77   Ht 5\' 3"  (1.6 m)   Wt 175 lb (79.4 kg)   BMI 31.00 kg/m   General:  Obese woman seated comfortably in the exam room. HEENT: No conjunctival pallor or scleral icterus.  Moist mucous membranes.  OP clear. Neck: Supple without lymphadenopathy, thyromegaly, JVD, or HJR.  No carotid bruit. Lungs: Normal work of breathing.  Clear to auscultation bilaterally without wheezes or crackles. Heart: Regular rate and rhythm without murmurs, rubs, or gallops. Mid systolic click is intermittently appreciated on exam. Non-displaced PMI. Abd: Bowel sounds present.  Soft, NT/ND without hepatosplenomegaly Ext: No lower extremity edema.  Radial, PT, and DP pulses are 2+ bilaterally Skin: warm and dry without rash Neuro: CNIII-XII intact.  Strength and fine-touch sensation intact in upper and lower extremities bilaterally. Psych:  Normal mood and affect.  EKG:  Normal sinus rhythm with occasional multifocal PVCs. Nonspecific T-wave changes.  Lab Results  Component Value Date   WBC 10.0 12/20/2014   HGB 13.0 12/20/2014   HCT 38.1 12/20/2014   MCV 83.4 12/20/2014   PLT 155 12/20/2014    Lab Results  Component Value Date   NA 136 04/26/2012   K 3.9 04/26/2012   CL 103 04/26/2012   CO2 25 04/26/2012   BUN 13 04/26/2012   CREATININE 1.1 04/26/2012   GLUCOSE 96 04/26/2012   ALT 21 04/26/2012    Lab Results  Component Value Date   CHOL 220 (H) 04/26/2012   HDL 30.20 (L) 04/26/2012   LDLDIRECT 151.7 04/26/2012   TRIG 238.0 (H) 04/26/2012   CHOLHDL 7 04/26/2012     --------------------------------------------------------------------------------------------------  ASSESSMENT AND PLAN: Atypical chest pain Symptoms are not exertional and are atypical for coronary insufficiency. Only significant risk factor is obesity. We have agreed to repeat echocardiogram  to reevaluate patient's history of mitral valve prolapse and to screen for other structural abnormalities. We will defer ischemia testing at this time.  Palpitations I suspect the patient feels PVCs, which she reports having previously had on event monitors. Given that symptoms seem to be worsening, we will proceed with the aforementioned echocardiogram. We will check a basic metabolic panel, magnesium level, and TSH today, to evaluate for potential electrolyte or hormonal causes of her symptoms. We will not make any medication changes at this time pending our evaluation.  Elevated blood pressure Blood pressure is mildly elevated today. It has been normal to borderline elevated at prior office visits over the last 1-2 years. Patient was encouraged to continue with a low sodium diet. I will defer further management to the patient's PCP.  Follow-up: Return to clinic in one month.  Yvonne Kendall, MD 04/13/2016 1:49 PM

## 2016-04-13 NOTE — Patient Instructions (Signed)
Medication Instructions:  Your physician recommends that you continue on your current medications as directed. Please refer to the Current Medication list given to you today.   Labwork: Your physician recommends that you return for lab work in: TODAY (BMP, MG, TSH) - [facility]. You will check in at the front desk to the right as you walk into the atrium. Valet Parking is offered if needed.     Testing/Procedures: Your physician has requested that you have an echocardiogram. Echocardiography is a painless test that uses sound waves to create images of your heart. It provides your doctor with information about the size and shape of your heart and how well your heart's chambers and valves are working. This procedure takes approximately one hour. There are no restrictions for this procedure.    Follow-Up: Your physician recommends that you schedule a follow-up appointment in: 1 MONTH WITH DR END.   Echocardiogram An echocardiogram, or echocardiography, uses sound waves (ultrasound) to produce an image of your heart. The echocardiogram is simple, painless, obtained within a short period of time, and offers valuable information to your health care provider. The images from an echocardiogram can provide information such as:  Evidence of coronary artery disease (CAD).  Heart size.  Heart muscle function.  Heart valve function.  Aneurysm detection.  Evidence of a past heart attack.  Fluid buildup around the heart.  Heart muscle thickening.  Assess heart valve function. Tell a health care provider about:  Any allergies you have.  All medicines you are taking, including vitamins, herbs, eye drops, creams, and over-the-counter medicines.  Any problems you or family members have had with anesthetic medicines.  Any blood disorders you have.  Any surgeries you have had.  Any medical conditions you have.  Whether you are pregnant or may be  pregnant. What happens before the procedure? No special preparation is needed. Eat and drink normally. What happens during the procedure?  In order to produce an image of your heart, gel will be applied to your chest and a wand-like tool (transducer) will be moved over your chest. The gel will help transmit the sound waves from the transducer. The sound waves will harmlessly bounce off your heart to allow the heart images to be captured in real-time motion. These images will then be recorded.  You may need an IV to receive a medicine that improves the quality of the pictures. What happens after the procedure? You may return to your normal schedule including diet, activities, and medicines, unless your health care provider tells you otherwise. This information is not intended to replace advice given to you by your health care provider. Make sure you discuss any questions you have with your health care provider. Document Released: 12/31/1999 Document Revised: 08/21/2015 Document Reviewed: 09/09/2012 Elsevier Interactive Patient Education  2017 [employer]

## 2016-05-22 ENCOUNTER — Ambulatory Visit (INDEPENDENT_AMBULATORY_CARE_PROVIDER_SITE_OTHER): Payer: 59

## 2016-05-22 ENCOUNTER — Other Ambulatory Visit: Payer: Self-pay

## 2016-05-22 DIAGNOSIS — I493 Ventricular premature depolarization: Secondary | ICD-10-CM

## 2016-05-22 DIAGNOSIS — R002 Palpitations: Secondary | ICD-10-CM | POA: Diagnosis not present

## 2016-05-22 DIAGNOSIS — R0789 Other chest pain: Secondary | ICD-10-CM | POA: Diagnosis not present

## 2016-05-23 ENCOUNTER — Encounter: Payer: Self-pay | Admitting: Family Medicine

## 2016-05-24 ENCOUNTER — Ambulatory Visit (INDEPENDENT_AMBULATORY_CARE_PROVIDER_SITE_OTHER): Payer: 59 | Admitting: Internal Medicine

## 2016-05-24 ENCOUNTER — Encounter: Payer: Self-pay | Admitting: Internal Medicine

## 2016-05-24 VITALS — BP 110/71 | HR 87 | Ht 64.0 in | Wt 171.2 lb

## 2016-05-24 DIAGNOSIS — R002 Palpitations: Secondary | ICD-10-CM | POA: Diagnosis not present

## 2016-05-24 DIAGNOSIS — R0789 Other chest pain: Secondary | ICD-10-CM | POA: Diagnosis not present

## 2016-05-24 DIAGNOSIS — R030 Elevated blood-pressure reading, without diagnosis of hypertension: Secondary | ICD-10-CM | POA: Diagnosis not present

## 2016-05-24 NOTE — Progress Notes (Signed)
Follow-up Outpatient Visit Date: 05/24/2016  Primary Care Provider: Dianne DunAron, Talia M, MD [address] [place] [id number]  Chief Complaint: Follow-up palpitations and atypical chest pain  HPI:  [name] is a 46 y.o. year-old female with history of mitral valve prolapse, irritable bowel syndrome, depression, anxiety, gout, and obesity, who presents for follow-up of palpitations and atypical chest pain. I last saw her on 04/13/16. At that time, we agreed to obtain a transthoracic echocardiogram to reevaluate her mitral valve prolapse and to screen for other structural abnormalities contributing to her symptoms. Since her last visit, Jasmine Nolan had been feeling well up until about a week ago. Over the weekend, she developed a GI illness with nausea, vomiting, and diarrhea. She continues to have generalized weakness and intermittent headache. She is not had any chest pain, shortness of breath, lightheadedness, or edema. She remains aware of occasional skipped beats, though these are less pronounced than at her prior visit. She is taking omeprazole for possible component of GERD, which seems to be helping.  --------------------------------------------------------------------------------------------------  Cardiovascular History & Procedures: Cardiovascular Problems:  Mitral valve prolapse  Palpitations  Atypical chest pain  Risk Factors:  Obesity  Cath/PCI:  None  CV Surgery:  None  EP Procedures and Devices:  Patient reports wearing Holter monitor in the remote past and thinks that showed PVCs.  Non-Invasive Evaluation(s):  TTE (05/22/16): Normal LV size and contraction with LVEF of 55-60%. There are no regional wall motion abnormalities. Grade 1 diastolic dysfunction is noted. Normal RV size and function. No significant valvular abnormalities. A trivial pericardial effusion is present. Normal pulmonary artery pressure.  Recent CV Pertinent Labs: Lab Results  Component  Value Date   CHOL 220 (H) 04/26/2012   HDL 30.20 (L) 04/26/2012   LDLDIRECT 151.7 04/26/2012   TRIG 238.0 (H) 04/26/2012   CHOLHDL 7 04/26/2012   K 4.0 04/13/2016   MG 1.9 04/13/2016   BUN 16 04/13/2016   CREATININE 1.02 (H) 04/13/2016    Past medical and surgical history were reviewed and updated in EPIC.  Outpatient Encounter Prescriptions as of 05/24/2016  Medication Sig  . omeprazole (PRILOSEC) 20 MG capsule Take 20 mg by mouth at bedtime. Reported on 05/01/2015  . sertraline (ZOLOFT) 100 MG tablet Take 200 mg by mouth at bedtime.    No facility-administered encounter medications on file as of 05/24/2016.     Allergies: Bactrim; Dicyclomine hcl; and Tetanus toxoids  Social History   Social History  . Marital status: Divorced    Spouse name: N/A  . Number of children: 1  . Years of education: N/A   Occupational History  . medical billing [employer]   Social History Main Topics  . Smoking status: Former Smoker    Packs/day: 0.50    Years: 20.00    Types: Cigarettes    Quit date: 2010  . Smokeless tobacco: Never Used  . Alcohol use 0.0 oz/week     Comment: rarely  . Drug use: No  . Sexual activity: Not on file   Other Topics Concern  . Not on file   Social History Narrative  . No narrative on file    Family History  Problem Relation Age of Onset  . Colon cancer Father 2759       died of colon CA  . Depression Mother   . Arthritis Mother   . Heart disease Mother        Enlarged heart  . Heart murmur Brother   .  Arrhythmia Maternal Grandmother     Review of Systems: A 12-system review of systems was performed and was negative except as noted in the HPI.  --------------------------------------------------------------------------------------------------  Physical Exam: BP 110/71 (BP Location: Left Arm, Patient Position: Sitting, Cuff Size: Normal)   Pulse 87   Ht 5\' 4"  (1.626 m)   Wt 171 lb 4 oz (77.7 kg)   BMI 29.39 kg/m   General:   Overweight woman, seated comfortably in the exam room. HEENT: No conjunctival pallor or scleral icterus.  Moist mucous membranes.  OP clear. Neck: Supple without lymphadenopathy, thyromegaly, JVD, or HJR.  No carotid bruit. Lungs: Normal work of breathing.  Clear to auscultation bilaterally without wheezes or crackles. Heart: Regular rate and rhythm without murmurs, rubs, or gallops.  Non-displaced PMI. Abd: Bowel sounds present.  Mild epigastric tenderness. Soft and nondistended without hepatosplenomegaly. Ext: No lower extremity edema.  Radial, PT, and DP pulses are 2+ bilaterally. Skin: warm and dry without rash  Lab Results  Component Value Date   WBC 10.0 12/20/2014   HGB 13.0 12/20/2014   HCT 38.1 12/20/2014   MCV 83.4 12/20/2014   PLT 155 12/20/2014    Lab Results  Component Value Date   NA 137 04/13/2016   K 4.0 04/13/2016   CL 104 04/13/2016   CO2 26 04/13/2016   BUN 16 04/13/2016   CREATININE 1.02 (H) 04/13/2016   GLUCOSE 92 04/13/2016   ALT 21 04/26/2012    Lab Results  Component Value Date   CHOL 220 (H) 04/26/2012   HDL 30.20 (L) 04/26/2012   LDLDIRECT 151.7 04/26/2012   TRIG 238.0 (H) 04/26/2012   CHOLHDL 7 04/26/2012   --------------------------------------------------------------------------------------------------  ASSESSMENT AND PLAN: Palpitations Overall, symptoms are less pronounced. The patient continues to report occasional skipped beats most likely reflecting PACs or PVCs. No significant structural abnormalities noted on recent echo. We have agreed to defer further testing at this time.  Atypical chest pain Chest pain has resolved. I suspect this was most likely noncardiac in etiology. Trivial pericardial effusion was noted on echo, which could be seen with pericarditis. However, the patient does not have any symptoms to suggest this.  Elevated blood pressure Elevated blood pressure was noted at her last visit. It is normal today. No medication  changes. [name] should continue to follow with Dr. Dayton Martes, as previously arranged.  Follow-up: Return to clinic in 1 year, sooner if new symptoms or concerns arise.  Yvonne Kendall, MD 05/25/2016 9:46 PM

## 2016-05-24 NOTE — Patient Instructions (Signed)
Medication Instructions:  Your physician recommends that you continue on your current medications as directed. Please refer to the Current Medication list given to you today.   Labwork: NONE  Testing/Procedures: NONE  Follow-Up: Your physician wants you to follow-up in: 12 MONTHS WITH DR END. You will receive a reminder letter in the mail two months in advance. If you don't receive a letter, please call our office to schedule the follow-up appointment.   If you need a refill on your cardiac medications before your next appointment, please call your pharmacy.   

## 2016-05-25 ENCOUNTER — Encounter: Payer: Self-pay | Admitting: Internal Medicine

## 2016-05-31 ENCOUNTER — Encounter: Payer: Self-pay | Admitting: Family Medicine

## 2016-06-02 ENCOUNTER — Encounter: Payer: Self-pay | Admitting: Family Medicine

## 2016-06-02 ENCOUNTER — Ambulatory Visit (INDEPENDENT_AMBULATORY_CARE_PROVIDER_SITE_OTHER): Payer: 59 | Admitting: Family Medicine

## 2016-06-02 DIAGNOSIS — R05 Cough: Secondary | ICD-10-CM

## 2016-06-02 DIAGNOSIS — R059 Cough, unspecified: Secondary | ICD-10-CM

## 2016-06-02 MED ORDER — GUAIFENESIN-CODEINE 100-10 MG/5ML PO SYRP: 5.0000 mL | ORAL_SOLUTION | Freq: Every evening | ORAL | 0 refills | Status: DC | PRN

## 2016-06-02 NOTE — Patient Instructions (Addendum)
Stop other OTC meds. Start zyrtec at bedtime Flonase 2 spray per nostril daily. Use cough supressant at night Rest, fluids.

## 2016-06-02 NOTE — Progress Notes (Signed)
   Subjective:    Patient ID: Jasmine Nolan, female    DOB: [birth year], 46 y.o.   MRN: [id number]   46 year old female with history of allergies with progressively worsening cough.   Cough  This is a new problem. The current episode started in the past 7 days (1 week). The problem has been gradually worsening. The problem occurs constantly. The cough is non-productive. Associated symptoms include ear congestion, postnasal drip and a sore throat. Pertinent negatives include no chills, ear pain, fever, nasal congestion, rhinorrhea, shortness of breath or wheezing. Associated symptoms comments: Hoarse voice  no sinus pressure or pain. The symptoms are aggravated by lying down. Risk factors for lung disease include smoking/tobacco exposure and occupational exposure (former smoker). Treatments tried: dayquil, nyquil, claritin, delsym. The treatment provided mild relief. Her past medical history is significant for environmental allergies. There is no history of asthma or COPD.  Sore Throat   Associated symptoms include coughing. Pertinent negatives include no ear pain or shortness of breath.    Blood pressure 130/76, pulse 81, temperature 98.7 F (37.1 C), temperature source Oral, height 5\' 3"  (1.6 m), weight 172 lb 8 oz (78.2 kg).   Review of Systems  Constitutional: Negative for chills and fever.  HENT: Positive for postnasal drip and sore throat. Negative for ear pain and rhinorrhea.   Respiratory: Positive for cough. Negative for shortness of breath and wheezing.   Allergic/Immunologic: Positive for environmental allergies.       Objective:   Physical Exam  Constitutional: Vital signs are normal. She appears well-developed and well-nourished. She is cooperative.  Non-toxic appearance. She does not appear ill. No distress.  HENT:  Head: Normocephalic.  Right Ear: Hearing, external ear and ear canal normal. Tympanic membrane is not erythematous, not retracted and not bulging. A middle ear  effusion is present.  Left Ear: Hearing, external ear and ear canal normal. Tympanic membrane is not erythematous, not retracted and not bulging. A middle ear effusion is present.  Nose: Mucosal edema and rhinorrhea present. Right sinus exhibits no maxillary sinus tenderness and no frontal sinus tenderness. Left sinus exhibits no maxillary sinus tenderness and no frontal sinus tenderness.  Mouth/Throat: Uvula is midline and mucous membranes are normal. Posterior oropharyngeal erythema present. No oropharyngeal exudate or posterior oropharyngeal edema.  Eyes: Conjunctivae, EOM and lids are normal. Pupils are equal, round, and reactive to light. Lids are everted and swept, no foreign bodies found.  Neck: Trachea normal and normal range of motion. Neck supple. Carotid bruit is not present. No thyroid mass and no thyromegaly present.  Cardiovascular: Normal rate, regular rhythm, S1 normal, S2 normal, normal heart sounds, intact distal pulses and normal pulses.  Exam reveals no gallop and no friction rub.   No murmur heard. Pulmonary/Chest: Effort normal and breath sounds normal. No tachypnea. No respiratory distress. She has no decreased breath sounds. She has no wheezes. She has no rhonchi. She has no rales.  Neurological: She is alert.  Skin: Skin is warm, dry and intact. No rash noted.  Psychiatric: Her speech is normal and behavior is normal. Judgment normal. Her mood appears not anxious. Cognition and memory are normal. She does not exhibit a depressed mood.          Assessment & Plan:

## 2016-06-02 NOTE — Assessment & Plan Note (Signed)
Likely due to allergies vs viral URI.  Treat with antihistamine, start flonase. Use cough suppressant at night for cough.

## 2016-06-19 ENCOUNTER — Other Ambulatory Visit: Payer: Self-pay | Admitting: Family Medicine

## 2016-06-19 ENCOUNTER — Encounter: Payer: Self-pay | Admitting: Family Medicine

## 2016-06-19 MED ORDER — ALBUTEROL SULFATE HFA 108 (90 BASE) MCG/ACT IN AERS: 2.0000 | INHALATION_SPRAY | Freq: Four times a day (QID) | RESPIRATORY_TRACT | 0 refills | Status: DC | PRN

## 2016-06-21 ENCOUNTER — Encounter: Payer: Self-pay | Admitting: Internal Medicine

## 2016-08-01 ENCOUNTER — Encounter: Payer: 59 | Admitting: Internal Medicine

## 2016-08-28 ENCOUNTER — Other Ambulatory Visit: Payer: Self-pay | Admitting: Obstetrics and Gynecology

## 2016-08-28 DIAGNOSIS — Z1231 Encounter for screening mammogram for malignant neoplasm of breast: Secondary | ICD-10-CM

## 2016-09-05 ENCOUNTER — Encounter: Payer: 59 | Admitting: Internal Medicine

## 2016-09-06 ENCOUNTER — Ambulatory Visit
Admission: RE | Admit: 2016-09-06 | Discharge: 2016-09-06 | Disposition: A | Discharge status: Home / Self Care | Payer: 59 | Source: Ambulatory Visit | Attending: Obstetrics and Gynecology | Admitting: Obstetrics and Gynecology

## 2016-09-06 DIAGNOSIS — Z1231 Encounter for screening mammogram for malignant neoplasm of breast: Secondary | ICD-10-CM

## 2016-10-05 ENCOUNTER — Encounter: Payer: Self-pay | Admitting: Family Medicine

## 2016-10-06 ENCOUNTER — Other Ambulatory Visit: Payer: Self-pay | Admitting: Family Medicine

## 2016-10-06 ENCOUNTER — Ambulatory Visit: Payer: 59 | Admitting: Family Medicine

## 2016-10-06 DIAGNOSIS — M62838 Other muscle spasm: Secondary | ICD-10-CM

## 2016-10-13 DIAGNOSIS — M542 Cervicalgia: Secondary | ICD-10-CM | POA: Diagnosis not present

## 2016-10-13 DIAGNOSIS — M62838 Other muscle spasm: Secondary | ICD-10-CM | POA: Diagnosis not present

## 2016-10-17 DIAGNOSIS — M542 Cervicalgia: Secondary | ICD-10-CM | POA: Diagnosis not present

## 2016-10-17 DIAGNOSIS — M62838 Other muscle spasm: Secondary | ICD-10-CM | POA: Diagnosis not present

## 2016-10-20 ENCOUNTER — Encounter: Payer: Self-pay | Admitting: Family Medicine

## 2016-10-20 ENCOUNTER — Other Ambulatory Visit: Payer: Self-pay | Admitting: Family Medicine

## 2016-10-20 DIAGNOSIS — M62838 Other muscle spasm: Secondary | ICD-10-CM | POA: Diagnosis not present

## 2016-10-20 DIAGNOSIS — M542 Cervicalgia: Secondary | ICD-10-CM | POA: Diagnosis not present

## 2016-10-20 MED ORDER — MELOXICAM 15 MG PO TABS: 15.0000 mg | ORAL_TABLET | Freq: Every day | ORAL | 2 refills | Status: DC

## 2016-10-23 DIAGNOSIS — Z1211 Encounter for screening for malignant neoplasm of colon: Secondary | ICD-10-CM | POA: Diagnosis not present

## 2016-10-23 DIAGNOSIS — K644 Residual hemorrhoidal skin tags: Secondary | ICD-10-CM | POA: Diagnosis not present

## 2016-10-23 DIAGNOSIS — Z800 Family history of malignant neoplasm of digestive organs: Secondary | ICD-10-CM | POA: Diagnosis not present

## 2016-10-23 DIAGNOSIS — K648 Other hemorrhoids: Secondary | ICD-10-CM | POA: Diagnosis not present

## 2016-10-23 DIAGNOSIS — Z8371 Family history of colonic polyps: Secondary | ICD-10-CM | POA: Diagnosis not present

## 2016-10-31 DIAGNOSIS — M542 Cervicalgia: Secondary | ICD-10-CM | POA: Diagnosis not present

## 2016-10-31 DIAGNOSIS — M62838 Other muscle spasm: Secondary | ICD-10-CM | POA: Diagnosis not present

## 2016-11-03 DIAGNOSIS — M542 Cervicalgia: Secondary | ICD-10-CM | POA: Diagnosis not present

## 2016-11-03 DIAGNOSIS — M62838 Other muscle spasm: Secondary | ICD-10-CM | POA: Diagnosis not present

## 2016-11-10 DIAGNOSIS — M62838 Other muscle spasm: Secondary | ICD-10-CM | POA: Diagnosis not present

## 2016-11-10 DIAGNOSIS — M542 Cervicalgia: Secondary | ICD-10-CM | POA: Diagnosis not present

## 2016-12-28 ENCOUNTER — Encounter: Payer: Self-pay | Admitting: Family Medicine

## 2016-12-28 ENCOUNTER — Ambulatory Visit: Payer: 59 | Admitting: Family Medicine

## 2016-12-28 ENCOUNTER — Ambulatory Visit: Payer: Self-pay | Admitting: *Deleted

## 2016-12-28 VITALS — BP 132/88 | HR 83 | Temp 98.3°F | Ht 63.0 in | Wt 178.0 lb

## 2016-12-28 DIAGNOSIS — N3001 Acute cystitis with hematuria: Secondary | ICD-10-CM

## 2016-12-28 DIAGNOSIS — R300 Dysuria: Secondary | ICD-10-CM | POA: Diagnosis not present

## 2016-12-28 LAB — POC URINALSYSI DIPSTICK (AUTOMATED)
Glucose, UA: NEGATIVE
PH UA: 6 (ref 5.0–8.0)
Spec Grav, UA: 1.015 (ref 1.010–1.025)

## 2016-12-28 MED ORDER — CIPROFLOXACIN HCL 250 MG PO TABS: 250.0000 mg | ORAL_TABLET | Freq: Two times a day (BID) | ORAL | 0 refills | Status: DC

## 2016-12-28 NOTE — Patient Instructions (Addendum)
Drink lots of water  Take the cipro as directed -start now  Continue the urinary analgesic for several days if needed  Update if not starting to improve in a week or if worsening    We will contact you when the urine culture returns     Urinary Tract Infection, Adult A urinary tract infection (UTI) is an infection of any part of the urinary tract, which includes the kidneys, ureters, bladder, and urethra. These organs make, store, and get rid of urine in the body. UTI can be a bladder infection (cystitis) or kidney infection (pyelonephritis). What are the causes? This infection may be caused by fungi, viruses, or bacteria. Bacteria are the most common cause of UTIs. This condition can also be caused by repeated incomplete emptying of the bladder during urination. What increases the risk? This condition is more likely to develop if:  You ignore your need to urinate or hold urine for long periods of time.  You do not empty your bladder completely during urination.  You wipe back to front after urinating or having a bowel movement, if you are female.  You are uncircumcised, if you are female.  You are constipated.  You have a urinary catheter that stays in place (indwelling).  You have a weak defense (immune) system.  You have a medical condition that affects your bowels, kidneys, or bladder.  You have diabetes.  You take antibiotic medicines frequently or for long periods of time, and the antibiotics no longer work well against certain types of infections (antibiotic resistance).  You take medicines that irritate your urinary tract.  You are exposed to chemicals that irritate your urinary tract.  You are female.  What are the signs or symptoms? Symptoms of this condition include:  Fever.  Frequent urination or passing small amounts of urine frequently.  Needing to urinate urgently.  Pain or burning with urination.  Urine that smells bad or unusual.  Cloudy  urine.  Pain in the lower abdomen or back.  Trouble urinating.  Blood in the urine.  Vomiting or being less hungry than normal.  Diarrhea or abdominal pain.  Vaginal discharge, if you are female.  How is this diagnosed? This condition is diagnosed with a medical history and physical exam. You will also need to provide a urine sample to test your urine. Other tests may be done, including:  Blood tests.  Sexually transmitted disease (STD) testing.  If you have had more than one UTI, a cystoscopy or imaging studies may be done to determine the cause of the infections. How is this treated? Treatment for this condition often includes a combination of two or more of the following:  Antibiotic medicine.  Other medicines to treat less common causes of UTI.  Over-the-counter medicines to treat pain.  Drinking enough water to stay hydrated.  Follow these instructions at home:  Take over-the-counter and prescription medicines only as told by your health care provider.  If you were prescribed an antibiotic, take it as told by your health care provider. Do not stop taking the antibiotic even if you start to feel better.  Avoid alcohol, caffeine, tea, and carbonated beverages. They can irritate your bladder.  Drink enough fluid to keep your urine clear or pale yellow.  Keep all follow-up visits as told by your health care provider. This is important.  Make sure to: ? Empty your bladder often and completely. Do not hold urine for long periods of time. ? Empty your bladder before and  after sex. ? Wipe from front to back after a bowel movement if you are female. Use each tissue one time when you wipe. Contact a health care provider if:  You have back pain.  You have a fever.  You feel nauseous or vomit.  Your symptoms do not get better after 3 days.  Your symptoms go away and then return. Get help right away if:  You have severe back pain or lower abdominal pain.  You  are vomiting and cannot keep down any medicines or water. This information is not intended to replace advice given to you by your health care provider. Make sure you discuss any questions you have with your health care provider. Document Released: 10/12/2004 Document Revised: 06/16/2015 Document Reviewed: 11/23/2014 Elsevier Interactive Patient Education  2017 [employer]

## 2016-12-28 NOTE — Telephone Encounter (Signed)
Noticed blood in urine this am; also painful urination;nurse triage initiated; pt offered and accepted appointment with Dr Milinda Antisower today at 1115; pt verbalizes understanding; will route to [facility] pool to notify them of this upcoming appointment; pt is normally seen by Dr Para Marchuncan  Reason for Disposition . Age > 50 years . Blood in urine (red, pink, or tea-colored)  Answer Assessment - Initial Assessment Questions 1. SEVERITY: "How bad is the pain?"  (e.g., Scale 1-10; mild, moderate, or severe)   - MILD (1-3): complains slightly about urination hurting   - MODERATE (4-7): interferes with normal activities     - SEVERE (8-10): excruciating, unwilling or unable to urinate because of the pain      Mild 1-3 2. FREQUENCY: "How many times have you had painful urination today?"      4 3. PATTERN: "Is pain present every time you urinate or just sometimes?"      Yes every time 4. ONSET: "When did the painful urination start?"      12/28/16 at 0500 5. FEVER: "Do you have a fever?" If so, ask: "What is your temperature, how was it measured, and when did it start?"     no 6. PAST UTI: "Have you had a urine infection before?" If so, ask: "When was the last time?" and "What happened that time?"      Yes; years ago 7. CAUSE: "What do you think is causing the painful urination?"  (e.g., UTI, scratch, Herpes sore)     UTI 8. OTHER SYMPTOMS: "Do you have any other symptoms?" (e.g., flank pain, vaginal discharge, genital sores, urgency, blood in urine)     Blood in urine, and urgency 9. PREGNANCY: "Is there any chance you are pregnant?" "When was your last menstrual period?"     no hysterectomy  Protocols used: URINATION PAIN [facility]- FEMALE-A-AH

## 2016-12-28 NOTE — Progress Notes (Signed)
Subjective:    Patient ID: Jasmine Nolan, female    DOB: [birth year], 46 y.o.   MRN: [id number]  HPI Here for urinary symptoms   46 yo pt of Dr Dayton MartesAron with hx of hysterectomy and allergic to bactrim (former smoker)   5:30 this am - urgently had to urinate  Blood in urine -started bright red and then pink  Burns to urinate/also bladder pressure and pain  Used azo (urinary analgesic)  Some mild incontinence /dribbling  Urgency and frequency   No fever  Temp: 98.3 F (36.8 C)  No n/v  Last night had some flank pain (R side)    Hx of uti- has been hospitalized in the remote past for one   Pos ua today Results for orders placed or performed in visit on 12/28/16  POCT Urinalysis Dipstick (Automated)  Result Value Ref Range   Color, UA Orange    Clarity, UA Cloudy    Glucose, UA Negative    Bilirubin, UA     Ketones, UA     Spec Grav, UA 1.015 1.010 - 1.025   Blood, UA 200 Ery/uL    pH, UA 6.0 5.0 - 8.0   Protein, UA     Urobilinogen, UA  0.2 or 1.0 E.U./dL   Nitrite, UA     Leukocytes, UA Large (3+) (A) Negative   with azo   Patient Active Problem List   Diagnosis Date Noted  . Acute non-recurrent maxillary sinusitis 05/19/2015  . Wheezing 10/02/2013  . Allergic rhinitis 10/02/2013  . Chest wall pain 09/04/2013  . Cough 08/13/2013  . Gout 10/04/2012  . GERD (gastroesophageal reflux disease) 04/25/2012  . Panic attacks 10/11/2011  . Fatigue 07/26/2011  . Cutaneous skin tags 06/01/2011  . Heart palpitations 01/26/2011  . Mitral valve prolapse 01/26/2011  . Inguinal mass 12/26/2010  . Exposure to STD 10/13/2010  . Obesity 06/06/2010  . Arthritis   . Depression    Past Medical History:  Diagnosis Date  . Anxiety   . Arthritis   . Depression   . Gout   . IBS (irritable bowel syndrome)   . Inguinal mass   . Internal hemorrhoids   . Lateral epicondylitis of elbow   . MVP (mitral valve prolapse)   . Obesity   . Pneumonia   . Sinusitis    Past Surgical  History:  Procedure Laterality Date  . CERVICAL FUSION  2011   Cram, 2 level  . CESAREAN SECTION  2001  . ENDOMETRIAL ABLATION    . LUMBAR DISC SURGERY  June 2011  . SPINE SURGERY    . TUBAL LIGATION     Social History   Tobacco Use  . Smoking status: Former Smoker    Packs/day: 0.50    Years: 20.00    Pack years: 10.00    Types: Cigarettes    Last attempt to quit: 2010    Years since quitting: 8.9  . Smokeless tobacco: Never Used  Substance Use Topics  . Alcohol use: Yes    Alcohol/week: 0.0 oz    Comment: rarely  . Drug use: No   Family History  Problem Relation Age of Onset  . Colon cancer Father [id number]       died of colon CA  . Depression Mother   . Arthritis Mother   . Heart disease Mother        Enlarged heart  . Heart murmur Brother   . Arrhythmia Maternal Grandmother   . Breast  cancer Neg Hx    Allergies  Allergen Reactions  . Bactrim Nausea And Vomiting  . Dicyclomine Hcl Rash  . Tetanus Toxoids Other (See Comments)    Reaction:  Fever    Current Outpatient Medications on File Prior to Visit  Medication Sig Dispense Refill  . omeprazole (PRILOSEC) 20 MG capsule Take 20 mg by mouth at bedtime. Reported on 05/01/2015    . sertraline (ZOLOFT) 100 MG tablet Take 200 mg by mouth at bedtime.      No current facility-administered medications on file prior to visit.     Review of Systems  Constitutional: Positive for fatigue. Negative for activity change, appetite change and fever.  HENT: Negative for congestion and sore throat.   Eyes: Negative for itching and visual disturbance.  Respiratory: Negative for cough and shortness of breath.   Cardiovascular: Negative for leg swelling.  Gastrointestinal: Negative for abdominal distention, abdominal pain, constipation, diarrhea and nausea.  Endocrine: Negative for cold intolerance and polydipsia.  Genitourinary: Positive for dysuria, flank pain, frequency, hematuria and urgency. Negative for decreased urine volume,  difficulty urinating, pelvic pain and vaginal discharge.  Musculoskeletal: Negative for myalgias.  Skin: Negative for rash.  Allergic/Immunologic: Negative for immunocompromised state.  Neurological: Negative for dizziness and weakness.  Hematological: Negative for adenopathy.       Objective:   Physical Exam  Constitutional: She appears well-developed and well-nourished. No distress.  obese and well appearing   HENT:  Head: Normocephalic and atraumatic.  Eyes: Conjunctivae and EOM are normal. Pupils are equal, round, and reactive to light.  Neck: Normal range of motion. Neck supple.  Cardiovascular: Normal rate, regular rhythm and normal heart sounds.  Pulmonary/Chest: Effort normal and breath sounds normal.  Abdominal: Soft. Bowel sounds are normal. She exhibits no distension. There is tenderness. There is no rebound.  Mild R  cva tenderness  Mild suprapubic tenderness  Musculoskeletal: She exhibits no edema.  Lymphadenopathy:    She has no cervical adenopathy.  Neurological: She is alert.  Skin: No rash noted. No pallor.  Psychiatric: She has a normal mood and affect.          Assessment & Plan:   Problem List Items Addressed This Visit      Genitourinary   Acute cystitis with hematuria - Primary    With pos ua and complicated by mild R flank pain  Overall reassuring exam/pt was non toxic  Cover with cipro 250 mg bid for 7 d Pt cannot take sulfa Disc uti tx and prev Analgesic ok for 1-2 d  Pending cx Update if not starting to improve in a several days or if worsening        Relevant Orders   Urine Culture    Other Visit Diagnoses    Dysuria       Relevant Orders   POCT Urinalysis Dipstick (Automated) (Completed)

## 2016-12-28 NOTE — Progress Notes (Signed)
Or  

## 2016-12-28 NOTE — Assessment & Plan Note (Signed)
With pos ua and complicated by mild R flank pain  Overall reassuring exam/pt was non toxic  Cover with cipro 250 mg bid for 7 d Pt cannot take sulfa Disc uti tx and prev Analgesic ok for 1-2 d  Pending cx Update if not starting to improve in a several days or if worsening

## 2016-12-28 NOTE — Telephone Encounter (Signed)
I will see her then  

## 2016-12-30 LAB — URINE CULTURE
MICRO NUMBER: 81402691
SPECIMEN QUALITY: ADEQUATE

## 2017-01-11 ENCOUNTER — Encounter: Payer: Self-pay | Admitting: Family Medicine

## 2017-01-15 ENCOUNTER — Ambulatory Visit: Payer: 59 | Admitting: Family Medicine

## 2017-01-15 ENCOUNTER — Encounter: Payer: Self-pay | Admitting: Family Medicine

## 2017-01-15 VITALS — BP 138/87 | HR 87 | Temp 98.1°F | Ht 63.0 in | Wt 178.0 lb

## 2017-01-15 DIAGNOSIS — M542 Cervicalgia: Secondary | ICD-10-CM | POA: Diagnosis not present

## 2017-01-15 MED ORDER — CYCLOBENZAPRINE HCL 10 MG PO TABS: 10.0000 mg | ORAL_TABLET | Freq: Three times a day (TID) | ORAL | 1 refills | Status: DC | PRN

## 2017-01-15 MED ORDER — DICLOFENAC SODIUM 2 % TD SOLN: 1.0000 "application " | Freq: Two times a day (BID) | TRANSDERMAL | 3 refills | Status: AC

## 2017-01-15 NOTE — Assessment & Plan Note (Signed)
Pain likely related to trigger point of trapezius and rhomboid. Her work station is likely contributing since she works as a Scientific laboratory techniciancomputer all day.  - trigger point inj x 2  - counseled on HEP for scapular stabilization and neck ROM  - provided flexeril and Pennsaid  - counseled on modifying her work station to improve her posture.

## 2017-01-15 NOTE — Patient Instructions (Signed)
Please try to take the muscle relaxer on as needed basis.  Please try to change your work station to help with your posture.  Please try Aspercreme with lidocaine.

## 2017-01-15 NOTE — Progress Notes (Signed)
Jasmine HatcherLori F Nolan - 46 y.o. female MRN [id number]  Date of birth: 1970/10/16  SUBJECTIVE:  Including CC & ROS.  Chief Complaint  Patient presents with  . Neck Pain      Jasmine Nolan is a 46 y.o. female that is presenting with left trapezius and left medial scapular pain. Has a history of cervical radiculopathy down her left arm and had a cervical fusion in 2011. Admits intermittent tingling. She has completed physical therapy and dry needling with some improvement. The physical therapy was completed one month ago. Pain has been increasing the past 6 months. Pain is described as a constant burning sensation. Denies certain positions that trigger the pain.  Denies any recent injures to the area. She works at a deck all day. The pain is moderate to severe in nature. The pain has been waking her up at night.    Independent review of her cervical spine x-ray from 2014 shows anterior fusion at C5-7.   Review of Systems  Constitutional: Negative for fever.  Respiratory: Negative for cough.   Cardiovascular: Negative for chest pain.  Gastrointestinal: Negative for abdominal pain.  Musculoskeletal: Positive for neck pain. Negative for gait problem.  Skin: Negative for color change.  Neurological: Negative for weakness.  Hematological: Negative for adenopathy.  Psychiatric/Behavioral: Negative for agitation.    HISTORY: Past Medical, Surgical, Social, and Family History Reviewed & Updated per EMR.   Pertinent Historical Findings include:  Past Medical History:  Diagnosis Date  . Anxiety   . Arthritis   . Depression   . Gout   . IBS (irritable bowel syndrome)   . Inguinal mass   . Internal hemorrhoids   . Lateral epicondylitis of elbow   . MVP (mitral valve prolapse)   . Obesity   . Pneumonia   . Sinusitis     Past Surgical History:  Procedure Laterality Date  . CERVICAL FUSION  2011   Cram, 2 level  . CESAREAN SECTION  2001  . ENDOMETRIAL ABLATION    . LUMBAR DISC SURGERY  June 2011    . SPINE SURGERY    . TUBAL LIGATION      Allergies  Allergen Reactions  . Bactrim Nausea And Vomiting  . Dicyclomine Hcl Rash  . Tetanus Toxoids Other (See Comments)    Reaction:  Fever     Family History  Problem Relation Age of Onset  . Colon cancer Father [id number]       died of colon CA  . Depression Mother   . Arthritis Mother   . Heart disease Mother        Enlarged heart  . Heart murmur Brother   . Arrhythmia Maternal Grandmother   . Breast cancer Neg Hx      Social History   Socioeconomic History  . Marital status: Divorced    Spouse name: Not on file  . Number of children: 1  . Years of education: Not on file  . Highest education level: Not on file  Social Needs  . Financial resource strain: Not on file  . Food insecurity - worry: Not on file  . Food insecurity - inability: Not on file  . Transportation needs - medical: Not on file  . Transportation needs - non-medical: Not on file  Occupational History  . Occupation: medical billing    Employer: [employer]  Tobacco Use  . Smoking status: Former Smoker    Packs/day: 0.50    Years: 20.00    Pack years:  10.00    Types: Cigarettes    Last attempt to quit: 2010    Years since quitting: 9.0  . Smokeless tobacco: Never Used  Substance and Sexual Activity  . Alcohol use: Yes    Alcohol/week: 0.0 oz    Comment: rarely  . Drug use: No  . Sexual activity: Not on file  Other Topics Concern  . Not on file  Social History Narrative  . Not on file     PHYSICAL EXAM:  VS: BP 138/87 (BP Location: Left Arm, Patient Position: Sitting, Cuff Size: Normal)   Pulse 87   Temp 98.1 F (36.7 C) (Oral)   Ht 5\' 3"  (1.6 m)   Wt 178 lb (80.7 kg)   SpO2 97%   BMI 31.53 kg/m  Physical Exam Gen: NAD, alert, cooperative with exam, well-appearing ENT: normal lips, normal nasal mucosa,  Eye: normal EOM, normal conjunctiva and lids CV:  no edema, +2 pedal pulses   Resp: no accessory muscle use, non-labored,   Skin: no rashes, no areas of induration  Neuro: normal tone, normal sensation to touch Psych:  normal insight, alert and oriented MSK:  Neck:  TTP of left trapezius on the superior scapular border.  TTP of the rhomboids on the medial aspect of the scapular border  Normal flexion and extension  Limited side bending and lateral rotation to the left compared to the right.  Normal strength to resistance with shrug.  Normal shoulder ROM  Normal empty can test  Normal IR and ER strength to resistance.  Normal pincer grasp No signs of atrophy  Normal wrist strength to resistance  Normal elbow ROM and strength to resistance  Neurovascularly intact.     Aspiration/Injection Procedure Note Jasmine HatcherLori F Nolan 1970-10-09  Procedure: Injection Indications: Left trapezius pain  Procedure Details Consent: Risks of procedure as well as the alternatives and risks of each were explained to the (patient/caregiver).  Consent for procedure obtained. Time Out: Verified patient identification, verified procedure, site/side was marked, verified correct patient position, special equipment/implants available, medications/allergies/relevent history reviewed, required imaging and test results available.  Performed.  The area was cleaned with iodine and alcohol swabs.    The left trapezius trigger point was injected using 0.5 cc's of 40 mg Depomedrol and 1.5 cc's of 1% lidocaine with a 25 1 1/2" needle.  Ultrasound was used. Images were obtained in Long views showing the injection.   A sterile dressing was applied.  Patient did tolerate procedure well.    Aspiration/Injection Procedure Note Jasmine HatcherLori F Nolan 1970-10-09  Procedure: Injection Indications: left rhomboid pain  Procedure Details Consent: Risks of procedure as well as the alternatives and risks of each were explained to the (patient/caregiver).  Consent for procedure obtained. Time Out: Verified patient identification, verified procedure, site/side  was marked, verified correct patient position, special equipment/implants available, medications/allergies/relevent history reviewed, required imaging and test results available.  Performed.  The area was cleaned with iodine and alcohol swabs.    The left rhomboid trigger point was injected using 0.5 cc's of 40 mg Depomedrol and 1.5 cc's of 1% lidocaine with a 25 1 1/2" needle.  Ultrasound was used. Images were obtained in Long views showing the injection.    A sterile dressing was applied.  Patient did tolerate procedure well.          ASSESSMENT & PLAN:   Neck pain Pain likely related to trigger point of trapezius and rhomboid. Her work station is likely contributing since she works  as a computer all day.  - trigger point inj x 2  - counseled on HEP for scapular stabilization and neck ROM  - provided flexeril and Pennsaid  - counseled on modifying her work station to improve her posture.

## 2017-01-24 DIAGNOSIS — K648 Other hemorrhoids: Secondary | ICD-10-CM | POA: Diagnosis not present

## 2017-01-24 DIAGNOSIS — K644 Residual hemorrhoidal skin tags: Secondary | ICD-10-CM | POA: Diagnosis not present

## 2017-02-07 DIAGNOSIS — K644 Residual hemorrhoidal skin tags: Secondary | ICD-10-CM | POA: Diagnosis not present

## 2017-03-01 DIAGNOSIS — Z01419 Encounter for gynecological examination (general) (routine) without abnormal findings: Secondary | ICD-10-CM | POA: Diagnosis not present

## 2017-03-01 DIAGNOSIS — Z6831 Body mass index (BMI) 31.0-31.9, adult: Secondary | ICD-10-CM | POA: Diagnosis not present

## 2017-03-12 ENCOUNTER — Ambulatory Visit (INDEPENDENT_AMBULATORY_CARE_PROVIDER_SITE_OTHER): Payer: 59 | Admitting: Family Medicine

## 2017-03-12 ENCOUNTER — Encounter: Payer: Self-pay | Admitting: Family Medicine

## 2017-03-12 VITALS — BP 132/78 | HR 109 | Temp 99.4°F | Ht 63.0 in | Wt 181.2 lb

## 2017-03-12 DIAGNOSIS — R070 Pain in throat: Secondary | ICD-10-CM | POA: Diagnosis not present

## 2017-03-12 DIAGNOSIS — J069 Acute upper respiratory infection, unspecified: Secondary | ICD-10-CM

## 2017-03-12 LAB — POCT RAPID STREP A (OFFICE): RAPID STREP A SCREEN: NEGATIVE

## 2017-03-12 MED ORDER — AMOXICILLIN-POT CLAVULANATE 875-125 MG PO TABS: 1.0000 | ORAL_TABLET | Freq: Two times a day (BID) | ORAL | 0 refills | Status: AC

## 2017-03-12 NOTE — Patient Instructions (Signed)
Great to see you. Take augmentin as directed- 1 tablet twice daily for 10 days.

## 2017-03-12 NOTE — Progress Notes (Signed)
SUBJECTIVE:  Jasmine Nolan is a 47 y.o. female who complains of congestion, sore throat and swollen glands for 3 weeks. She denies a history of anorexia, chest pain and shortness of breath and denies a history of asthma. Patient denies smoke cigarettes.   Current Outpatient Medications on File Prior to Visit  Medication Sig Dispense Refill  . cyclobenzaprine (FLEXERIL) 10 MG tablet Take 1 tablet (10 mg total) by mouth 3 (three) times daily as needed for muscle spasms. 30 tablet 1  . Diclofenac Sodium (PENNSAID) 2 % SOLN Place 1 application onto the skin 2 (two) times daily. 1 Bottle 3  . omeprazole (PRILOSEC) 20 MG capsule Take 20 mg by mouth at bedtime. Reported on 05/01/2015    . sertraline (ZOLOFT) 100 MG tablet Take 200 mg by mouth at bedtime.      No current facility-administered medications on file prior to visit.     Allergies  Allergen Reactions  . Bactrim Nausea And Vomiting  . Dicyclomine Hcl Rash  . Tetanus Toxoids Other (See Comments)    Reaction:  Fever     Past Medical History:  Diagnosis Date  . Anxiety   . Arthritis   . Depression   . Gout   . IBS (irritable bowel syndrome)   . Inguinal mass   . Internal hemorrhoids   . Lateral epicondylitis of elbow   . MVP (mitral valve prolapse)   . Obesity   . Pneumonia   . Sinusitis     Past Surgical History:  Procedure Laterality Date  . CERVICAL FUSION  2011   Cram, 2 level  . CESAREAN SECTION  2001  . ENDOMETRIAL ABLATION    . LUMBAR DISC SURGERY  June 2011  . SPINE SURGERY    . TUBAL LIGATION      Family History  Problem Relation Age of Onset  . Colon cancer Father [phone]       died of colon CA  . Depression Mother   . Arthritis Mother   . Heart disease Mother        Enlarged heart  . Heart murmur Brother   . Arrhythmia Maternal Grandmother   . Breast cancer Neg Hx     Social History   Socioeconomic History  . Marital status: Divorced    Spouse name: Not on file  . Number of children: 1  . Years of  education: Not on file  . Highest education level: Not on file  Social Needs  . Financial resource strain: Not on file  . Food insecurity - worry: Not on file  . Food insecurity - inability: Not on file  . Transportation needs - medical: Not on file  . Transportation needs - non-medical: Not on file  Occupational History  . Occupation: medical billing    Employer: [employer]  Tobacco Use  . Smoking status: Former Smoker    Packs/day: 0.50    Years: 20.00    Pack years: 10.00    Types: Cigarettes    Last attempt to quit: 2010    Years since quitting: 9.1  . Smokeless tobacco: Never Used  Substance and Sexual Activity  . Alcohol use: Yes    Alcohol/week: 0.0 oz    Comment: rarely  . Drug use: No  . Sexual activity: Not on file  Other Topics Concern  . Not on file  Social History Narrative  . Not on file   The PMH, PSH, Social History, Family History, Medications, and allergies have been  reviewed in [facility], and have been updated if relevant.  OBJECTIVE: BP 132/78 (BP Location: Left Arm, Patient Position: Sitting, Cuff Size: Normal)   Pulse (!) 109   Temp 99.4 F (37.4 C) (Oral)   Ht 5\' 3"  (1.6 m)   Wt 181 lb 3.2 oz (82.2 kg)   SpO2 97%   BMI 32.10 kg/m   She appears well, vital signs are as noted. Left TM dull, erythematous.  Throat and pharynx normal.  Neck supple. No adenopathy in the neck. Nose is congested. Sinuses tender. The chest is clear, without wheezes or rales.  ASSESSMENT:    upper respiratory illness, probably bacterial given duration of symptoms  PLAN: Augmentin twice daily x 10 days. Symptomatic therapy suggested: push fluids, rest and return office visit prn if symptoms persist or worsen. Call or return to clinic prn if these symptoms worsen or fail to improve as anticipated.

## 2017-03-23 ENCOUNTER — Other Ambulatory Visit: Payer: Self-pay | Admitting: Family Medicine

## 2017-03-23 ENCOUNTER — Encounter: Payer: Self-pay | Admitting: Family Medicine

## 2017-03-23 MED ORDER — FLUCONAZOLE 150 MG PO TABS: 150.0000 mg | ORAL_TABLET | Freq: Once | ORAL | 0 refills | Status: AC

## 2017-03-28 DIAGNOSIS — K648 Other hemorrhoids: Secondary | ICD-10-CM | POA: Diagnosis not present

## 2017-03-28 DIAGNOSIS — K644 Residual hemorrhoidal skin tags: Secondary | ICD-10-CM | POA: Diagnosis not present

## 2017-03-29 ENCOUNTER — Encounter: Payer: Self-pay | Admitting: Family Medicine

## 2017-04-11 ENCOUNTER — Encounter: Payer: Self-pay | Admitting: Family Medicine

## 2017-04-16 ENCOUNTER — Encounter: Payer: Self-pay | Admitting: Family Medicine

## 2017-04-16 ENCOUNTER — Ambulatory Visit: Payer: 59 | Admitting: Family Medicine

## 2017-04-16 VITALS — BP 152/98 | HR 81 | Temp 98.4°F | Ht 63.0 in | Wt 182.0 lb

## 2017-04-16 DIAGNOSIS — Z981 Arthrodesis status: Secondary | ICD-10-CM | POA: Diagnosis not present

## 2017-04-16 DIAGNOSIS — M542 Cervicalgia: Secondary | ICD-10-CM

## 2017-04-16 MED ORDER — PREDNISONE 5 MG PO TABS: ORAL_TABLET | ORAL | 0 refills | Status: DC

## 2017-04-16 NOTE — Assessment & Plan Note (Signed)
Has similar pain. Still appears to be muscular. Doesn't appear to be related to hardware.  - prednisone - referral to neurosurgery Dr. Wynetta Emeryram. Can evaluate if there is any component of loose hardware  - counseled on supportive care

## 2017-04-16 NOTE — Patient Instructions (Signed)
Please try if a cervical soft collar will help with your pain  Please try to alternate heat and ice

## 2017-04-16 NOTE — Progress Notes (Signed)
Jasmine Nolan - 47 y.o. female MRN [id number]  Date of birth: [birth year]  SUBJECTIVE:  Including CC & ROS.  Chief Complaint  Patient presents with  . Follow-up    Jasmine Nolan is a 47 y.o. female that is here today for neck pain follow up. She received previous trigger injections which improved her pain. She has been using Pennsaid and taking flexeril for the pain. Pain is constant, located at the base of her neck. Denies certain movements that exacerbate the pain. Denies tingling or numbness. She has a history of an anterior fusion at C5-7 performed in 2011. She had relief with the trigger point injections. Pain started about 3 weeks ago. Pain is worse at the end of the day. She has tried PT and has a standing desk for work.    Review of Systems  Constitutional: Negative for fever.  HENT: Negative for congestion.   Respiratory: Negative for cough.   Musculoskeletal: Positive for neck pain.  Skin: Negative for color change.  Neurological: Negative for weakness.  Hematological: Negative for adenopathy.  Psychiatric/Behavioral: Negative for agitation.    HISTORY: Past Medical, Surgical, Social, and Family History Reviewed & Updated per EMR.   Pertinent Historical Findings include:  Past Medical History:  Diagnosis Date  . Anxiety   . Arthritis   . Depression   . Gout   . IBS (irritable bowel syndrome)   . Inguinal mass   . Internal hemorrhoids   . Lateral epicondylitis of elbow   . MVP (mitral valve prolapse)   . Obesity   . Pneumonia   . Sinusitis     Past Surgical History:  Procedure Laterality Date  . CERVICAL FUSION  2011   Cram, 2 level  . CESAREAN SECTION  2001  . ENDOMETRIAL ABLATION    . LUMBAR DISC SURGERY  June 2011  . SPINE SURGERY    . TUBAL LIGATION      Allergies  Allergen Reactions  . Bactrim Nausea And Vomiting  . Dicyclomine Hcl Rash  . Tetanus Toxoids Other (See Comments)    Reaction:  Fever     Family History  Problem Relation Age of Onset    . Colon cancer Father [id number]       died of colon CA  . Depression Mother   . Arthritis Mother   . Heart disease Mother        Enlarged heart  . Heart murmur Brother   . Arrhythmia Maternal Grandmother   . Breast cancer Neg Hx      Social History   Socioeconomic History  . Marital status: Divorced    Spouse name: Not on file  . Number of children: 1  . Years of education: Not on file  . Highest education level: Not on file  Occupational History  . 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 Financial resource strain: Not on file  . Food insecurity:    Worry: Not on file    Inability: Not on file  . Transportation needs:    Medical: Not on file    Non-medical: Not on file  Tobacco Use  . Smoking status: Former Smoker    Packs/day: 0.50    Years: 20.00    Pack years: 10.00    Types: Cigarettes    Last attempt to quit: 2010    Years since quitting: 9.2  . Smokeless tobacco: Never Used  Substance and Sexual Activity  . Alcohol use:  Yes    Alcohol/week: 0.0 oz    Comment: rarely  . Drug use: No  . Sexual activity: Not on file  Lifestyle  . Physical activity:    Days per week: Not on file    Minutes per session: Not on file  . Stress: Not on file  Relationships  . Social connections:    Talks on phone: Not on file    Gets together: Not on file    Attends religious service: Not on file    Active member of club or organization: Not on file    Attends meetings of clubs or organizations: Not on file    Relationship status: Not on file  . Intimate partner violence:    Fear of current or ex partner: Not on file    Emotionally abused: Not on file    Physically abused: Not on file    Forced sexual activity: Not on file  Other Topics Concern  . Not on file  Social History Narrative  . Not on file     PHYSICAL EXAM:  VS: BP (!) 152/98 (BP Location: Left Arm, Patient Position: Sitting, Cuff Size: Normal)   Pulse 81   Temp 98.4 F  (36.9 C) (Oral)   Ht 5\' 3"  (1.6 m)   Wt 182 lb (82.6 kg)   SpO2 95%   BMI 32.24 kg/m  Physical Exam Gen: NAD, alert, cooperative with exam, well-appearing ENT: normal lips, normal nasal mucosa,  Eye: normal EOM, normal conjunctiva and lids CV:  no edema, +2 pedal pulses   Resp: no accessory muscle use, non-labored,  Skin: no rashes, no areas of induration  Neuro: normal tone, normal sensation to touch Psych:  normal insight, alert and oriented MSK:  Neck:  No cervical midline TTP  No TTP of the paraspinal neck muscles.  Normal ROM  Normal shoulder ROM No signs of atrophy Neurovascularly intact      ASSESSMENT & PLAN:   Neck pain Has similar pain. Still appears to be muscular. Doesn't appear to be related to hardware.  - prednisone - referral to neurosurgery Dr. Wynetta Emery. Can evaluate if there is any component of loose hardware  - counseled on supportive care

## 2017-05-02 ENCOUNTER — Encounter: Payer: Self-pay | Admitting: Family Medicine

## 2017-05-08 DIAGNOSIS — M5412 Radiculopathy, cervical region: Secondary | ICD-10-CM | POA: Diagnosis not present

## 2017-05-08 DIAGNOSIS — M542 Cervicalgia: Secondary | ICD-10-CM | POA: Diagnosis not present

## 2017-05-10 DIAGNOSIS — M542 Cervicalgia: Secondary | ICD-10-CM | POA: Diagnosis not present

## 2017-05-10 DIAGNOSIS — M5412 Radiculopathy, cervical region: Secondary | ICD-10-CM | POA: Diagnosis not present

## 2017-05-22 DIAGNOSIS — M5412 Radiculopathy, cervical region: Secondary | ICD-10-CM | POA: Diagnosis not present

## 2017-05-22 DIAGNOSIS — M542 Cervicalgia: Secondary | ICD-10-CM | POA: Diagnosis not present

## 2017-06-20 ENCOUNTER — Ambulatory Visit: Payer: 59 | Admitting: Internal Medicine

## 2017-06-20 ENCOUNTER — Encounter: Payer: Self-pay | Admitting: Internal Medicine

## 2017-06-20 VITALS — BP 140/86 | HR 89 | Ht 63.0 in | Wt 180.0 lb

## 2017-06-20 DIAGNOSIS — M4802 Spinal stenosis, cervical region: Secondary | ICD-10-CM | POA: Diagnosis not present

## 2017-06-20 DIAGNOSIS — Z01812 Encounter for preprocedural laboratory examination: Secondary | ICD-10-CM | POA: Diagnosis not present

## 2017-06-20 DIAGNOSIS — I341 Nonrheumatic mitral (valve) prolapse: Secondary | ICD-10-CM | POA: Diagnosis not present

## 2017-06-20 DIAGNOSIS — Z01810 Encounter for preprocedural cardiovascular examination: Secondary | ICD-10-CM

## 2017-06-20 DIAGNOSIS — R002 Palpitations: Secondary | ICD-10-CM

## 2017-06-20 DIAGNOSIS — M5412 Radiculopathy, cervical region: Secondary | ICD-10-CM | POA: Diagnosis not present

## 2017-06-20 DIAGNOSIS — R0789 Other chest pain: Secondary | ICD-10-CM

## 2017-06-20 DIAGNOSIS — R030 Elevated blood-pressure reading, without diagnosis of hypertension: Secondary | ICD-10-CM

## 2017-06-20 NOTE — Progress Notes (Signed)
Follow-up Outpatient Visit Date: 06/20/2017  Primary Care Provider: Dianne DunAron, Talia M, MD [address] [place] [id number]  Chief Complaint: Follow-up mitral valve prolapse  HPI:  Jasmine Nolan is a 47 y.o. year-old female with history of mitral valve prolapse, IBS, depression, anxiety, gout, and obesity, who presents for follow-up of chest pain and palpitations.  I last saw her a year ago, at which time she noted that her palpitations were less pronounced than at her previous visit.  At the time, she was also dealing with an acute GI illness.  No further testing or medication changes were recommended at the time.  Today, Jasmine Nolan is doing well with the exception of neck pain and left arm discomfort/paresthesias related to cervical spine disease.  She is scheduled to undergo cervical spine surgery next week.  She continues to have intermittent palpitations with associated vague chest discomfort that she has had since her 7520s.  Palpitations come on randomly and sometimes last up to.  Chest discomfort is not exertional and is described as an ache.  She denies shortness of breath, lightheadedness, and edema.  She is walking on a regular basis without limitations.  She does not check her blood pressure regularly at home.  --------------------------------------------------------------------------------------------------  Cardiovascular History & Procedures: Cardiovascular Problems:  Mitral valve prolapse  Palpitations  Atypical chest pain  Risk Factors:  Obesity  Cath/PCI:  None  CV Surgery:  None  EP Procedures and Devices:  Patient reports wearing Holter monitor in the remote past and thinks that showed PVCs.  Non-Invasive Evaluation(s):  TTE (05/22/16): Normal LV size and contraction with LVEF of 55-60%. There are no regional wall motion abnormalities. Grade 1 diastolic dysfunction is noted. Normal RV size and function. No significant valvular abnormalities. A trivial  pericardial effusion is present. Normal pulmonary artery pressure.  Recent CV Pertinent Labs: Lab Results  Component Value Date   CHOL 220 (H) 04/26/2012   HDL 30.20 (L) 04/26/2012   LDLDIRECT 151.7 04/26/2012   TRIG 238.0 (H) 04/26/2012   CHOLHDL 7 04/26/2012   K 4.0 04/13/2016   MG 1.9 04/13/2016   BUN 16 04/13/2016   CREATININE 1.02 (H) 04/13/2016    Past medical and surgical history were reviewed and updated in EPIC.  Current Meds  Medication Sig  . omeprazole (PRILOSEC) 20 MG capsule Take 20 mg by mouth at bedtime. Reported on 05/01/2015  . sertraline (ZOLOFT) 100 MG tablet Take 200 mg by mouth at bedtime.     Allergies: Bactrim; Dicyclomine hcl; and Tetanus toxoids  Social History   Tobacco Use  . Smoking status: Former Smoker    Packs/day: 0.50    Years: 20.00    Pack years: 10.00    Types: Cigarettes    Last attempt to quit: 2010    Years since quitting: 9.4  . Smokeless tobacco: Never Used  Substance Use Topics  . Alcohol use: Yes    Alcohol/week: 0.0 oz    Comment: rarely  . Drug use: No    Family History  Problem Relation Age of Onset  . Colon cancer Father [id number]       died of colon CA  . Depression Mother   . Arthritis Mother   . Heart disease Mother        Enlarged heart  . Heart murmur Brother   . Arrhythmia Maternal Grandmother   . Breast cancer Neg Hx     Review of Systems: A 12-system review of systems was performed and  was negative except as noted in the HPI.  --------------------------------------------------------------------------------------------------  Physical Exam: BP 140/86   Pulse 89   Ht 5\' 3"  (1.6 m)   Wt 180 lb (81.6 kg)   BMI 31.89 kg/m   General: NAD. HEENT: No conjunctival pallor or scleral icterus. Moist mucous membranes.  OP clear. Neck: Supple without lymphadenopathy, thyromegaly, JVD, or HJR. Lungs: Normal work of breathing. Clear to auscultation bilaterally without wheezes or crackles. Heart: Regular rate and  rhythm without murmurs, rubs, or gallops. Non-displaced PMI. Abd: Bowel sounds present. Soft, NT/ND without hepatosplenomegaly Ext: No lower extremity edema. Radial, PT, and DP pulses are 2+ bilaterally. Skin: Warm and dry without rash.  I personally observed Jasmine Nolan climb 4 flights of stairs without angina or shortness of breath.  EKG: Normal sinus rhythm with poor R wave progression in V2 through V4.  Previously noted PVC no longer present.  Otherwise, no significant change since prior tracing on 04/13/2016.  Lab Results  Component Value Date   WBC 10.0 12/20/2014   HGB 13.0 12/20/2014   HCT 38.1 12/20/2014   MCV 83.4 12/20/2014   PLT 155 12/20/2014    Lab Results  Component Value Date   NA 137 04/13/2016   K 4.0 04/13/2016   CL 104 04/13/2016   CO2 26 04/13/2016   BUN 16 04/13/2016   CREATININE 1.02 (H) 04/13/2016   GLUCOSE 92 04/13/2016   ALT 21 04/26/2012    Lab Results  Component Value Date   CHOL 220 (H) 04/26/2012   HDL 30.20 (L) 04/26/2012   LDLDIRECT 151.7 04/26/2012   TRIG 238.0 (H) 04/26/2012   CHOLHDL 7 04/26/2012    --------------------------------------------------------------------------------------------------  ASSESSMENT AND PLAN: Palpitations and atypical chest pain with history of mitral valve prolapse Symptoms have been present for many years and are stable.  Chest discomfort is not exertional.  EKG today shows late R wave transition across the precordial leads, though this has been noted on prior EKGs as far back as 2013.  We have agreed to defer further work-up at this time.  If symptoms worsen, ischemia testing will need to be considered.  Preoperative cardiovascular risk assessment Jasmine Nolan does not have any new or progressive symptoms to suggest unstable cardiac disease.  She is able to perform more than 4 METS of activity without limitations.  I think it is reasonable for her to proceed with her planned cervical spine intervention without  further cardiovascular testing.  Elevated blood pressure Blood pressure borderline high today and may be exacerbated by neck pain and stress related to upcoming surgery and recent death in the family.  I will provide Jasmine Nolan with information regarding the DASH diet.  I have asked her to check her blood pressure occasionally at work and to let us know if it is consistently above 140/90.  Follow-up: Return to clinic in 1 year.  Yvonne Kendall, MD 06/20/2017 8:34 AM

## 2017-06-20 NOTE — Patient Instructions (Addendum)
Medication Instructions:  Your physician recommends that you continue on your current medications as directed. Please refer to the Current Medication list given to you today.  -- If you need a refill on your cardiac medications before your next appointment, please call your pharmacy. --  Labwork: None ordered  Testing/Procedures: None ordered  Follow-Up: Your physician wants you to follow-up in: 1 year with Dr. Okey Dupre.    You will receive a reminder letter in the mail two months in advance. If you don't receive a letter, please call our office to schedule the follow-up appointment.  Thank you for choosing [facility]!!    Any Other Special Instructions Will Be Listed Below (If Applicable).  Inform us if BP is greater than 140/90 on a consistent basis.   DASH Eating Plan DASH stands for "Dietary Approaches to Stop Hypertension." The DASH eating plan is a healthy eating plan that has been shown to reduce high blood pressure (hypertension). It may also reduce your risk for type 2 diabetes, heart disease, and stroke. The DASH eating plan may also help with weight loss. What are tips for following this plan? General guidelines  Avoid eating more than 2,300 mg (milligrams) of salt (sodium) a day. If you have hypertension, you may need to reduce your sodium intake to 1,500 mg a day.  Limit alcohol intake to no more than 1 drink a day for nonpregnant women and 2 drinks a day for men. One drink equals 12 oz of beer, 5 oz of wine, or 1 oz of hard liquor.  Work with your health care provider to maintain a healthy body weight or to lose weight. Ask what an ideal weight is for you.  Get at least 30 minutes of exercise that causes your heart to beat faster (aerobic exercise) most days of the week. Activities may include walking, swimming, or biking.  Work with your health care provider or diet and nutrition specialist (dietitian) to adjust your eating plan to your individual calorie  needs. Reading food labels  Check food labels for the amount of sodium per serving. Choose foods with less than 5 percent of the Daily Value of sodium. Generally, foods with less than 300 mg of sodium per serving fit into this eating plan.  To find whole grains, look for the word "whole" as the first word in the ingredient list. Shopping  Buy products labeled as "low-sodium" or "no salt added."  Buy fresh foods. Avoid canned foods and premade or frozen meals. Cooking  Avoid adding salt when cooking. Use salt-free seasonings or herbs instead of table salt or sea salt. Check with your health care provider or pharmacist before using salt substitutes.  Do not fry foods. Cook foods using healthy methods such as baking, boiling, grilling, and broiling instead.  Cook with heart-healthy oils, such as olive, canola, soybean, or sunflower oil. Meal planning   Eat a balanced diet that includes: ? 5 or more servings of fruits and vegetables each day. At each meal, try to fill half of your plate with fruits and vegetables. ? Up to 6-8 servings of whole grains each day. ? Less than 6 oz of lean meat, poultry, or fish each day. A 3-oz serving of meat is about the same size as a deck of cards. One egg equals 1 oz. ? 2 servings of low-fat dairy each day. ? A serving of nuts, seeds, or beans 5 times each week. ? Heart-healthy fats. Healthy fats called Omega-3 fatty acids are found in  found in foods such as flaxseeds and coldwater fish, like sardines, salmon, and mackerel.  Limit how much you eat of the following: ? Canned or prepackaged foods. ? Food that is high in trans fat, such as fried foods. ? Food that is high in saturated fat, such as fatty meat. ? Sweets, desserts, sugary drinks, and other foods with added sugar. ? Full-fat dairy products.  Do not salt foods before eating.  Try to eat at least 2 vegetarian meals each week.  Eat more home-cooked food and less restaurant, buffet, and fast  food.  When eating at a restaurant, ask that your food be prepared with less salt or no salt, if possible. What foods are recommended? The items listed may not be a complete list. Talk with your dietitian about what dietary choices are best for you. Grains Whole-grain or whole-wheat bread. Whole-grain or whole-wheat pasta. Brown rice. Oatmeal. Quinoa. Bulgur. Whole-grain and low-sodium cereals. Pita bread. Low-fat, low-sodium crackers. Whole-wheat flour tortillas. Vegetables Fresh or frozen vegetables (raw, steamed, roasted, or grilled). Low-sodium or reduced-sodium tomato and vegetable juice. Low-sodium or reduced-sodium tomato sauce and tomato paste. Low-sodium or reduced-sodium canned vegetables. Fruits All fresh, dried, or frozen fruit. Canned fruit in natural juice (without added sugar). Meat and other protein foods Skinless chicken or turkey. Ground chicken or turkey. Pork with fat trimmed off. Fish and seafood. Egg whites. Dried beans, peas, or lentils. Unsalted nuts, nut butters, and seeds. Unsalted canned beans. Lean cuts of beef with fat trimmed off. Low-sodium, lean deli meat. Dairy Low-fat (1%) or fat-free (skim) milk. Fat-free, low-fat, or reduced-fat cheeses. Nonfat, low-sodium ricotta or cottage cheese. Low-fat or nonfat yogurt. Low-fat, low-sodium cheese. Fats and oils Soft margarine without trans fats. Vegetable oil. Low-fat, reduced-fat, or light mayonnaise and salad dressings (reduced-sodium). Canola, safflower, olive, soybean, and sunflower oils. Avocado. Seasoning and other foods Herbs. Spices. Seasoning mixes without salt. Unsalted popcorn and pretzels. Fat-free sweets. What foods are not recommended? The items listed may not be a complete list. Talk with your dietitian about what dietary choices are best for you. Grains Baked goods made with fat, such as croissants, muffins, or some breads. Dry pasta or rice meal packs. Vegetables Creamed or fried vegetables. Vegetables  in a cheese sauce. Regular canned vegetables (not low-sodium or reduced-sodium). Regular canned tomato sauce and paste (not low-sodium or reduced-sodium). Regular tomato and vegetable juice (not low-sodium or reduced-sodium). Pickles. Olives. Fruits Canned fruit in a light or heavy syrup. Fried fruit. Fruit in cream or butter sauce. Meat and other protein foods Fatty cuts of meat. Ribs. Fried meat. Bacon. Sausage. Bologna and other processed lunch meats. Salami. Fatback. Hotdogs. Bratwurst. Salted nuts and seeds. Canned beans with added salt. Canned or smoked fish. Whole eggs or egg yolks. Chicken or turkey with skin. Dairy Whole or 2% milk, cream, and half-and-half. Whole or full-fat cream cheese. Whole-fat or sweetened yogurt. Full-fat cheese. Nondairy creamers. Whipped toppings. Processed cheese and cheese spreads. Fats and oils Butter. Stick margarine. Lard. Shortening. Ghee. Bacon fat. Tropical oils, such as coconut, palm kernel, or palm oil. Seasoning and other foods Salted popcorn and pretzels. Onion salt, garlic salt, seasoned salt, table salt, and sea salt. Worcestershire sauce. Tartar sauce. Barbecue sauce. Teriyaki sauce. Soy sauce, including reduced-sodium. Steak sauce. Canned and packaged gravies. Fish sauce. Oyster sauce. Cocktail sauce. Horseradish that you find on the shelf. Ketchup. Mustard. Meat flavorings and tenderizers. Bouillon cubes. Hot sauce and Tabasco sauce. Premade or packaged marinades. Premade or packaged taco seasonings.   salad dressings. Where to find more information:  National Heart, Lung, and Blood Institute: PopSteam.iswww.nhlbi.nih.gov  American Heart Association: www.heart.org Summary  The DASH eating plan is a healthy eating plan that has been shown to reduce high blood pressure (hypertension). It may also reduce your risk for type 2 diabetes, heart disease, and stroke.  With the DASH eating plan, you should limit salt (sodium) intake to 2,300 mg a  day. If you have hypertension, you may need to reduce your sodium intake to 1,500 mg a day.  When on the DASH eating plan, aim to eat more fresh fruits and vegetables, whole grains, lean proteins, low-fat dairy, and heart-healthy fats.  Work with your health care provider or diet and nutrition specialist (dietitian) to adjust your eating plan to your individual calorie needs. This information is not intended to replace advice given to you by your health care provider. Make sure you discuss any questions you have with your health care provider. Document Released: 12/22/2010 Document Revised: 12/27/2015 Document Reviewed: 12/27/2015 Elsevier Interactive Patient Education  [employer]2018 Elsevier Inc.

## 2017-06-27 DIAGNOSIS — M4722 Other spondylosis with radiculopathy, cervical region: Secondary | ICD-10-CM | POA: Diagnosis not present

## 2017-06-27 DIAGNOSIS — Z981 Arthrodesis status: Secondary | ICD-10-CM | POA: Diagnosis not present

## 2017-06-27 DIAGNOSIS — M5412 Radiculopathy, cervical region: Secondary | ICD-10-CM | POA: Diagnosis not present

## 2017-06-27 DIAGNOSIS — M5020 Other cervical disc displacement, unspecified cervical region: Secondary | ICD-10-CM | POA: Diagnosis not present

## 2017-06-27 DIAGNOSIS — M4802 Spinal stenosis, cervical region: Secondary | ICD-10-CM | POA: Diagnosis not present

## 2017-07-31 ENCOUNTER — Encounter: Payer: Self-pay | Admitting: Family Medicine

## 2017-07-31 DIAGNOSIS — M542 Cervicalgia: Secondary | ICD-10-CM | POA: Diagnosis not present

## 2017-08-15 ENCOUNTER — Other Ambulatory Visit: Payer: Self-pay | Admitting: Obstetrics and Gynecology

## 2017-08-15 DIAGNOSIS — Z1231 Encounter for screening mammogram for malignant neoplasm of breast: Secondary | ICD-10-CM

## 2017-09-06 DIAGNOSIS — L738 Other specified follicular disorders: Secondary | ICD-10-CM | POA: Diagnosis not present

## 2017-09-06 DIAGNOSIS — L720 Epidermal cyst: Secondary | ICD-10-CM | POA: Diagnosis not present

## 2017-09-06 DIAGNOSIS — L821 Other seborrheic keratosis: Secondary | ICD-10-CM | POA: Diagnosis not present

## 2017-09-11 ENCOUNTER — Ambulatory Visit
Admission: RE | Admit: 2017-09-11 | Discharge: 2017-09-11 | Disposition: A | Discharge status: Home / Self Care | Payer: 59 | Source: Ambulatory Visit | Attending: Obstetrics and Gynecology | Admitting: Obstetrics and Gynecology

## 2017-09-11 DIAGNOSIS — Z1231 Encounter for screening mammogram for malignant neoplasm of breast: Secondary | ICD-10-CM

## 2017-09-21 ENCOUNTER — Encounter: Payer: Self-pay | Admitting: Family Medicine

## 2017-09-25 DIAGNOSIS — M542 Cervicalgia: Secondary | ICD-10-CM | POA: Diagnosis not present

## 2017-10-01 ENCOUNTER — Telehealth: Payer: Self-pay | Admitting: Family Medicine

## 2017-10-01 NOTE — Telephone Encounter (Signed)
Called patient to make patient aware that her appointment need to be cancel/rescheduled due to Dr. Dayton MartesAron will not be in the office on 9/17. Patient states she will check her calendar and call office back.

## 2017-10-02 ENCOUNTER — Ambulatory Visit: Payer: 59 | Admitting: Family Medicine

## 2017-10-08 NOTE — Telephone Encounter (Unsigned)
Copied from CRM [id number]. Topic: General - Other >> Oct 08, 2017 11:15 AM Percival SpanishKennedy, Cheryl W wrote:  Pt called and would like to [place] either Dr Mclean-Scocuzza ot Leanora CoverLauren Guse

## 2017-10-10 ENCOUNTER — Telehealth: Payer: Self-pay

## 2017-10-10 NOTE — Telephone Encounter (Signed)
Copied from CRM #[id number]. Topic: General - Other >> Oct 08, 2017 11:15 AM Kennedy, Cheryl W wrote:  Pt called and would like to transfer from Dr Aron at [facility] to either Dr Mclean-Scocuzza ot Lauren Guse 

## 2017-10-10 NOTE — Telephone Encounter (Signed)
Yes okay with me.

## 2017-10-10 NOTE — Telephone Encounter (Signed)
Ok   tms

## 2017-10-10 NOTE — Telephone Encounter (Unsigned)
Copied from CRM #[id number]. Topic: General - Other >> Oct 08, 2017 11:15 AM Kennedy, Cheryl W wrote:  Pt called and would like to transfer from Dr Aron at [facility] to either Dr Mclean-Scocuzza ot Lauren Guse 

## 2017-10-11 ENCOUNTER — Encounter: Payer: Self-pay | Admitting: Family Medicine

## 2017-10-11 ENCOUNTER — Ambulatory Visit (INDEPENDENT_AMBULATORY_CARE_PROVIDER_SITE_OTHER): Payer: 59 | Admitting: Family Medicine

## 2017-10-11 VITALS — BP 148/100 | HR 99 | Temp 98.7°F | Ht 63.0 in | Wt 177.2 lb

## 2017-10-11 DIAGNOSIS — F329 Major depressive disorder, single episode, unspecified: Secondary | ICD-10-CM | POA: Diagnosis not present

## 2017-10-11 DIAGNOSIS — F32A Depression, unspecified: Secondary | ICD-10-CM

## 2017-10-11 DIAGNOSIS — Z981 Arthrodesis status: Secondary | ICD-10-CM | POA: Diagnosis not present

## 2017-10-11 DIAGNOSIS — K219 Gastro-esophageal reflux disease without esophagitis: Secondary | ICD-10-CM

## 2017-10-11 DIAGNOSIS — M542 Cervicalgia: Secondary | ICD-10-CM

## 2017-10-11 DIAGNOSIS — I10 Essential (primary) hypertension: Secondary | ICD-10-CM | POA: Diagnosis not present

## 2017-10-11 MED ORDER — LISINOPRIL-HYDROCHLOROTHIAZIDE 10-12.5 MG PO TABS: 1.0000 | ORAL_TABLET | Freq: Every day | ORAL | 1 refills | Status: DC

## 2017-10-11 NOTE — Progress Notes (Signed)
Subjective:    Patient ID: Jasmine Nolan, female    DOB: 1970/02/09, 47 y.o.   MRN: [id number]  HPI   Presents to clinic as TOC from Dr Dayton Martes who recently changed office locations.  Patient's Problem list, past medical history, social history, surgical history, family history updated accordingly in chart.  Patient recently had cervical spine surgery, overall pain is been well controlled with the use of NSAIDs and muscle relaxers.  GERD is controlled with Prilosec 20 mg daily.  Mood is stable on Zoloft 200mg  at bedtime.  Patient has had a rough past couple of weeks due to mother passing away unexpectedly, but feels she has good support from her brother and also is close with her stepfather. Denies SI.   Is concerned with her blood pressure.  She has been monitoring at home for the past 3 to 4 months, and it has been consistently trending high.  Blood pressures at home have been in the 150s over 90s to 160s over 100 ranges.  Patient Active Problem List   Diagnosis Date Noted  . History of fusion of cervical spine 04/16/2017  . Neck pain 01/15/2017  . Acute cystitis with hematuria 12/28/2016  . Acute non-recurrent maxillary sinusitis 05/19/2015  . Wheezing 10/02/2013  . Allergic rhinitis 10/02/2013  . Chest wall pain 09/04/2013  . Cough 08/13/2013  . Gout 10/04/2012  . Atypical chest pain 04/25/2012  . GERD (gastroesophageal reflux disease) 04/25/2012  . Panic attacks 10/11/2011  . Fatigue 07/26/2011  . Cutaneous skin tags 06/01/2011  . Palpitations 01/26/2011  . Mitral valve prolapse 01/26/2011  . Inguinal mass 12/26/2010  . Exposure to STD 10/13/2010  . Obesity 06/06/2010  . Arthritis   . Depression    Social History   Tobacco Use  . Smoking status: Former Smoker    Packs/day: 0.50    Years: 20.00    Pack years: 10.00    Types: Cigarettes    Last attempt to quit: 2010    Years since quitting: 9.7  . Smokeless tobacco: Never Used  Substance Use Topics  . Alcohol  use: Not Currently    Alcohol/week: 0.0 standard drinks    Frequency: Never    Comment: rarely   Past Surgical History:  Procedure Laterality Date  . CERVICAL FUSION  2011   Cram, 2 level  . CESAREAN SECTION  2001  . ENDOMETRIAL ABLATION    . LUMBAR DISC SURGERY  June 2011  . SPINE SURGERY    . TUBAL LIGATION     Family History  Problem Relation Age of Onset  . Colon cancer Father 32       died of colon CA  . Depression Mother   . Arthritis Mother   . Heart disease Mother        Enlarged heart  . Heart murmur Brother   . Arrhythmia Maternal Grandmother   . Breast cancer Neg Hx     Review of Systems  Constitutional: Negative for chills, fatigue and fever.  HENT: Negative for congestion, ear pain, sinus pain and sore throat.   Eyes: Negative.   Respiratory: Negative for cough, shortness of breath and wheezing.   Cardiovascular: Negative for chest pain, palpitations and leg swelling.  Gastrointestinal: Negative for abdominal pain, diarrhea, nausea and vomiting.  Genitourinary: Negative for dysuria, frequency and urgency.  Musculoskeletal: Chronic neck pain and muscle spasm.  Skin: Negative for color change, pallor and rash.  Neurological: Negative for syncope, light-headedness and headaches.  Psychiatric/Behavioral: The  patient is not nervous/anxious.       Objective:   Physical Exam   Constitutional: She appears well-developed and well-nourished. No distress.  Head: Normocephalic and atraumatic.  Eyes:  EOM are normal. No scleral icterus.  Neck: Normal range of motion. Neck supple. No tracheal deviation present. Scar on front of neck from recent Cspine surgery Cardiovascular: Normal rate, regular rhythm and normal heart sounds.  Pulmonary/Chest: Effort normal and breath sounds normal. No respiratory distress.  Neurological: She is alert and oriented to person, place, and time.  Gait normal  Skin: Skin is warm and dry. No pallor.  Psychiatric: She has a normal mood  and affect. Her behavior is normal. Thought content normal.   Nursing note and vitals reviewed.    Vitals:   10/11/17 1341  BP: (!) 148/100  Pulse: 99  Temp: 98.7 F (37.1 C)  SpO2: 95%   Assessment & Plan:    Essential hypertension- we will begin patient on a combination pill of lisinopril hydrochlorothiazide for blood pressure control.  Patient given handout outlining essential hypertension, strategies to help control it, good diet choices.  Neck pain/history of cervical spine fusion- neck pain currently controlled with use of anti-inflammatory muscle relaxers.  Depression- patient feels good on Zoloft 200 mg dose.  Overall feels she is dealing with the passing of her mother as best as possible.  GERD- patient uses omeprazole 20 mg with success.  She will continue this dose.  Patient will return to clinic in 1 week for fasting lab work (Future lab orders placed) and also BP check with nurse.  If blood pressure readings have improved, we will plan for her to follow-up in 3 months time for office visit.

## 2017-10-11 NOTE — Patient Instructions (Signed)
Claritin, Allegra or Zyrtec are good OTC choices from allergies/congestion   Hypertension Hypertension, commonly called high blood pressure, is when the force of blood pumping through the arteries is too strong. The arteries are the blood vessels that carry blood from the heart throughout the body. Hypertension forces the heart to work harder to pump blood and may cause arteries to become narrow or stiff. Having untreated or uncontrolled hypertension can cause heart attacks, strokes, kidney disease, and other problems. A blood pressure reading consists of a higher number over a lower number. Ideally, your blood pressure should be below 120/80. The first ("top") number is called the systolic pressure. It is a measure of the pressure in your arteries as your heart beats. The second ("bottom") number is called the diastolic pressure. It is a measure of the pressure in your arteries as the heart relaxes. What are the causes? The cause of this condition is not known. What increases the risk? Some risk factors for high blood pressure are under your control. Others are not. Factors you can change  Smoking.  Having type 2 diabetes mellitus, high cholesterol, or both.  Not getting enough exercise or physical activity.  Being overweight.  Having too much fat, sugar, calories, or salt (sodium) in your diet.  Drinking too much alcohol. Factors that are difficult or impossible to change  Having chronic kidney disease.  Having a family history of high blood pressure.  Age. Risk increases with age.  Race. You may be at higher risk if you are African-American.  Gender. Men are at higher risk than women before age 23. After age 41, women are at higher risk than men.  Having obstructive sleep apnea.  Stress. What are the signs or symptoms? Extremely high blood pressure (hypertensive crisis) may cause:  Headache.  Anxiety.  Shortness of breath.  Nosebleed.  Nausea and  vomiting.  Severe chest pain.  Jerky movements you cannot control (seizures).  How is this diagnosed? This condition is diagnosed by measuring your blood pressure while you are seated, with your arm resting on a surface. The cuff of the blood pressure monitor will be placed directly against the skin of your upper arm at the level of your heart. It should be measured at least twice using the same arm. Certain conditions can cause a difference in blood pressure between your right and left arms. Certain factors can cause blood pressure readings to be lower or higher than normal (elevated) for a short period of time:  When your blood pressure is higher when you are in a health care provider's office than when you are at home, this is called white coat hypertension. Most people with this condition do not need medicines.  When your blood pressure is higher at home than when you are in a health care provider's office, this is called masked hypertension. Most people with this condition may need medicines to control blood pressure.  If you have a high blood pressure reading during one visit or you have normal blood pressure with other risk factors:  You may be asked to return on a different day to have your blood pressure checked again.  You may be asked to monitor your blood pressure at home for 1 week or longer.  If you are diagnosed with hypertension, you may have other blood or imaging tests to help your health care provider understand your overall risk for other conditions. How is this treated? This condition is treated by making healthy lifestyle changes, such  as eating healthy foods, exercising more, and reducing your alcohol intake. Your health care provider may prescribe medicine if lifestyle changes are not enough to get your blood pressure under control, and if:  Your systolic blood pressure is above 130.  Your diastolic blood pressure is above 80.  Your personal target blood pressure  may vary depending on your medical conditions, your age, and other factors. Follow these instructions at home: Eating and drinking  Eat a diet that is high in fiber and potassium, and low in sodium, added sugar, and fat. An example eating plan is called the DASH (Dietary Approaches to Stop Hypertension) diet. To eat this way: ? Eat plenty of fresh fruits and vegetables. Try to fill half of your plate at each meal with fruits and vegetables. ? Eat whole grains, such as whole wheat pasta, brown rice, or whole grain bread. Fill about one quarter of your plate with whole grains. ? Eat or drink low-fat dairy products, such as skim milk or low-fat yogurt. ? Avoid fatty cuts of meat, processed or cured meats, and poultry with skin. Fill about one quarter of your plate with lean proteins, such as fish, chicken without skin, beans, eggs, and tofu. ? Avoid premade and processed foods. These tend to be higher in sodium, added sugar, and fat.  Reduce your daily sodium intake. Most people with hypertension should eat less than 1,500 mg of sodium a day.  Limit alcohol intake to no more than 1 drink a day for nonpregnant women and 2 drinks a day for men. One drink equals 12 oz of beer, 5 oz of wine, or 1 oz of hard liquor. Lifestyle  Work with your health care provider to maintain a healthy body weight or to lose weight. Ask what an ideal weight is for you.  Get at least 30 minutes of exercise that causes your heart to beat faster (aerobic exercise) most days of the week. Activities may include walking, swimming, or biking.  Include exercise to strengthen your muscles (resistance exercise), such as pilates or lifting weights, as part of your weekly exercise routine. Try to do these types of exercises for 30 minutes at least 3 days a week.  Do not use any products that contain nicotine or tobacco, such as cigarettes and e-cigarettes. If you need help quitting, ask your health care provider.  Monitor your  blood pressure at home as told by your health care provider.  Keep all follow-up visits as told by your health care provider. This is important. Medicines  Take over-the-counter and prescription medicines only as told by your health care provider. Follow directions carefully. Blood pressure medicines must be taken as prescribed.  Do not skip doses of blood pressure medicine. Doing this puts you at risk for problems and can make the medicine less effective.  Ask your health care provider about side effects or reactions to medicines that you should watch for. Contact a health care provider if:  You think you are having a reaction to a medicine you are taking.  You have headaches that keep coming back (recurring).  You feel dizzy.  You have swelling in your ankles.  You have trouble with your vision. Get help right away if:  You develop a severe headache or confusion.  You have unusual weakness or numbness.  You feel faint.  You have severe pain in your chest or abdomen.  You vomit repeatedly.  You have trouble breathing. Summary  Hypertension is when the force of blood  pumping through your arteries is too strong. If this condition is not controlled, it may put you at risk for serious complications.  Your personal target blood pressure may vary depending on your medical conditions, your age, and other factors. For most people, a normal blood pressure is less than 120/80.  Hypertension is treated with lifestyle changes, medicines, or a combination of both. Lifestyle changes include weight loss, eating a healthy, low-sodium diet, exercising more, and limiting alcohol. This information is not intended to replace advice given to you by your health care provider. Make sure you discuss any questions you have with your health care provider. Document Released: 01/02/2005 Document Revised: 12/01/2015 Document Reviewed: 12/01/2015 Elsevier Interactive Patient Education  [employer].

## 2017-10-12 DIAGNOSIS — I10 Essential (primary) hypertension: Secondary | ICD-10-CM | POA: Insufficient documentation

## 2017-10-17 NOTE — Telephone Encounter (Signed)
Pt chose Lauren, Guse.

## 2017-10-18 ENCOUNTER — Ambulatory Visit: Payer: 59

## 2017-10-18 ENCOUNTER — Other Ambulatory Visit (INDEPENDENT_AMBULATORY_CARE_PROVIDER_SITE_OTHER): Payer: 59

## 2017-10-18 DIAGNOSIS — N289 Disorder of kidney and ureter, unspecified: Secondary | ICD-10-CM

## 2017-10-18 DIAGNOSIS — I10 Essential (primary) hypertension: Secondary | ICD-10-CM

## 2017-10-18 DIAGNOSIS — Z0130 Encounter for examination of blood pressure without abnormal findings: Secondary | ICD-10-CM

## 2017-10-18 LAB — LIPID PANEL
CHOLESTEROL: 231 mg/dL — AB (ref 0–200)
HDL: 36.3 mg/dL — ABNORMAL LOW (ref >39.00)
NonHDL: 194.68
TRIGLYCERIDES: 263 mg/dL — AB (ref 0.0–149.0)
Total CHOL/HDL Ratio: 6
VLDL: 52.6 mg/dL — AB (ref 0.0–40.0)

## 2017-10-18 LAB — CBC
HCT: 42.6 % (ref 36.0–46.0)
Hemoglobin: 14.6 g/dL (ref 12.0–15.0)
MCHC: 34.3 g/dL (ref 30.0–36.0)
MCV: 82.4 fl (ref 78.0–100.0)
Platelets: 193 10*3/uL (ref 150.0–400.0)
RBC: 5.17 Mil/uL — AB (ref 3.87–5.11)
RDW: 16 % — ABNORMAL HIGH (ref 11.5–15.5)
WBC: 7.1 10*3/uL (ref 4.0–10.5)

## 2017-10-18 LAB — COMPREHENSIVE METABOLIC PANEL
ALBUMIN: 4.4 g/dL (ref 3.5–5.2)
ALT: 23 U/L (ref 0–35)
AST: 22 U/L (ref 0–37)
Alkaline Phosphatase: 73 U/L (ref 39–117)
BILIRUBIN TOTAL: 0.9 mg/dL (ref 0.2–1.2)
BUN: 21 mg/dL (ref 6–23)
CALCIUM: 10.2 mg/dL (ref 8.4–10.5)
CHLORIDE: 101 meq/L (ref 96–112)
CO2: 33 mEq/L — ABNORMAL HIGH (ref 19–32)
CREATININE: 1.25 mg/dL — AB (ref 0.40–1.20)
GFR: 48.46 mL/min — AB (ref >60.00)
Glucose, Bld: 107 mg/dL — ABNORMAL HIGH (ref 70–99)
Potassium: 4.3 mEq/L (ref 3.5–5.1)
Sodium: 140 mEq/L (ref 135–145)
Total Protein: 7.3 g/dL (ref 6.0–8.3)

## 2017-10-18 LAB — TSH: TSH: 2 u[IU]/mL (ref 0.35–4.50)

## 2017-10-18 LAB — LDL CHOLESTEROL, DIRECT: LDL DIRECT: 159 mg/dL

## 2017-10-18 NOTE — Progress Notes (Signed)
Pt is here today for BP follow up.   Pt is currently taking the following: Lisinopril-hctz Omeprazole zoloft tizaniidine melocicam   Pharmacy: [facility] in [place] @ [address].  RA: 118/64 P:82 O2:97  LA: 98/64 P:68 O2:96  Sent to PCP to advise.   Pt stated that she is doing well on her BP medication no issues or side effects.

## 2017-10-18 NOTE — Progress Notes (Signed)
BP much better.   We will have her return to clinic in 3 months for office visit BP follow up  LG

## 2017-10-19 ENCOUNTER — Encounter: Payer: Self-pay | Admitting: Family Medicine

## 2017-10-19 NOTE — Addendum Note (Signed)
Addended by: Leanora Cover on: 10/19/2017 12:48 PM   Modules accepted: Orders

## 2017-10-19 NOTE — Progress Notes (Signed)
Repeat lab order

## 2017-10-24 NOTE — Progress Notes (Signed)
Already resulted - see lab note.

## 2017-10-29 DIAGNOSIS — M62838 Other muscle spasm: Secondary | ICD-10-CM | POA: Diagnosis not present

## 2017-10-29 DIAGNOSIS — M542 Cervicalgia: Secondary | ICD-10-CM | POA: Diagnosis not present

## 2017-10-31 DIAGNOSIS — M542 Cervicalgia: Secondary | ICD-10-CM | POA: Diagnosis not present

## 2017-10-31 DIAGNOSIS — M62838 Other muscle spasm: Secondary | ICD-10-CM | POA: Diagnosis not present

## 2017-11-01 ENCOUNTER — Telehealth: Payer: Self-pay | Admitting: Radiology

## 2017-11-01 DIAGNOSIS — N289 Disorder of kidney and ureter, unspecified: Secondary | ICD-10-CM

## 2017-11-01 NOTE — Addendum Note (Signed)
Addended by: Leanora Cover on: 11/01/2017 11:53 AM   Modules accepted: Orders

## 2017-11-01 NOTE — Telephone Encounter (Signed)
Pt coming in for labs tomorrow, please place future orders. Thank you.  

## 2017-11-02 ENCOUNTER — Other Ambulatory Visit (INDEPENDENT_AMBULATORY_CARE_PROVIDER_SITE_OTHER): Payer: 59

## 2017-11-02 DIAGNOSIS — N289 Disorder of kidney and ureter, unspecified: Secondary | ICD-10-CM

## 2017-11-02 DIAGNOSIS — I10 Essential (primary) hypertension: Secondary | ICD-10-CM

## 2017-11-02 LAB — BASIC METABOLIC PANEL
BUN: 22 mg/dL (ref 6–23)
CALCIUM: 10.2 mg/dL (ref 8.4–10.5)
CO2: 29 mEq/L (ref 19–32)
CREATININE: 1.24 mg/dL — AB (ref 0.40–1.20)
Chloride: 101 mEq/L (ref 96–112)
GFR: 48.9 mL/min — AB (ref >60.00)
Glucose, Bld: 100 mg/dL — ABNORMAL HIGH (ref 70–99)
Potassium: 4.3 mEq/L (ref 3.5–5.1)
Sodium: 139 mEq/L (ref 135–145)

## 2017-11-02 MED ORDER — LOSARTAN POTASSIUM 50 MG PO TABS: 50.0000 mg | ORAL_TABLET | Freq: Every day | ORAL | 3 refills | Status: DC

## 2017-11-02 NOTE — Addendum Note (Signed)
Addended by: Leanora Cover on: 11/02/2017 11:59 AM   Modules accepted: Orders

## 2017-11-12 DIAGNOSIS — M542 Cervicalgia: Secondary | ICD-10-CM | POA: Diagnosis not present

## 2017-11-12 DIAGNOSIS — M62838 Other muscle spasm: Secondary | ICD-10-CM | POA: Diagnosis not present

## 2017-11-19 DIAGNOSIS — M542 Cervicalgia: Secondary | ICD-10-CM | POA: Diagnosis not present

## 2017-11-19 DIAGNOSIS — M62838 Other muscle spasm: Secondary | ICD-10-CM | POA: Diagnosis not present

## 2017-11-22 DIAGNOSIS — M62838 Other muscle spasm: Secondary | ICD-10-CM | POA: Diagnosis not present

## 2017-11-22 DIAGNOSIS — M542 Cervicalgia: Secondary | ICD-10-CM | POA: Diagnosis not present

## 2017-11-26 DIAGNOSIS — M542 Cervicalgia: Secondary | ICD-10-CM | POA: Diagnosis not present

## 2017-11-26 DIAGNOSIS — M62838 Other muscle spasm: Secondary | ICD-10-CM | POA: Diagnosis not present

## 2017-11-28 ENCOUNTER — Ambulatory Visit: Payer: Self-pay | Admitting: *Deleted

## 2017-11-28 NOTE — Telephone Encounter (Signed)
MyChart message sent with c/o continued pain from June surgery and growing depression from my mom's sudden death.  Left message for pt to return call.

## 2017-11-28 NOTE — Telephone Encounter (Signed)
  Answer Assessment - Initial Assessment Questions 1. CONCERN: "What happened that made you call today?"     *No Answer* 2. DEPRESSION SYMPTOM SCREENING: "How are you feeling overall?" (e.g., decreased energy, increased sleeping or difficulty sleeping, difficulty concentrating, feelings of sadness, guilt, hopelessness, or worthlessness)     *No Answer* 3. RISK OF HARM - SUICIDAL IDEATION:  "Do you ever have thoughts of hurting or killing yourself?"  (e.g., yes, no, no but preoccupation with thoughts about death)   - INTENT:  "Do you have thoughts of hurting or killing yourself right NOW?" (e.g., yes, no, N/A)   - PLAN: "Do you have a specific plan for how you would do this?" (e.g., gun, knife, overdose, no plan, N/A)     *No Answer* 4. RISK OF HARM - HOMICIDAL IDEATION:  "Do you ever have thoughts of hurting or killing someone else?"  (e.g., yes, no, no but preoccupation with thoughts about death)   - INTENT:  "Do you have thoughts of hurting or killing someone right NOW?" (e.g., yes, no, N/A)   - PLAN: "Do you have a specific plan for how you would do this?" (e.g., gun, knife, no plan, N/A)      *No Answer* 5. FUNCTIONAL IMPAIRMENT: "How have things been going for you overall in your life? Have you had any more difficulties than usual doing your normal daily activities?"  (e.g., better, same, worse; self-care, school, work, interactions)     *No Answer* 6. SUPPORT: "Who is with you now?" "Who do you live with?" "Do you have family or friends nearby who you can talk to?"      *No Answer* 7. THERAPIST: "Do you have a counselor or therapist? Name?"     *No Answer* 8. STRESSORS: "Has there been any new stress or recent changes in your life?"     *No Answer* 9. DRUG ABUSE/ALCOHOL: "Do you drink alcohol or use any illegal drugs?"      *No Answer* 10. OTHER: "Do you have any other health or medical symptoms right now?" (e.g., fever)       *No Answer* 11. PREGNANCY: "Is there any chance you are  pregnant?" "When was your last menstrual period?"       *No Answer*  Protocols used: DEPRESSION-A-AH

## 2017-11-28 NOTE — Telephone Encounter (Signed)
  Reason for Disposition . [1] Depression AND [2] worsening (e.g.,sleeping poorly, less able to do activities of daily living)  Protocols used: DEPRESSION-A-AH  

## 2017-11-28 NOTE — Telephone Encounter (Signed)
  Answer Assessment - Initial Assessment Questions 1. CONCERN: "What happened that made you call today?"     "Feeling more in a funk" "Robotic" Mothers ashes were received Monday 2. DEPRESSION SYMPTOM SCREENING: "How are you feeling overall?" (e.g., decreased energy, increased sleeping or difficulty sleeping, difficulty concentrating, feelings of sadness, guilt, hopelessness, or worthlessness)    Decreased energy, sad, difficulty sleeping 3. RISK OF HARM - SUICIDAL IDEATION:  "Do you ever have thoughts of hurting or killing yourself?"  (e.g., yes, no, no but preoccupation with thoughts about death)   - INTENT:  "Do you have thoughts of hurting or killing yourself right NOW?" (e.g., yes, no, N/A)   - PLAN: "Do you have a specific plan for how you would do this?" (e.g., gun, knife, overdose, no plan, N/A)     no 4. RISK OF HARM - HOMICIDAL IDEATION:  "Do you ever have thoughts of hurting or killing someone else?"  (e.g., yes, no, no but preoccupation with thoughts about death)   - INTENT:  "Do you have thoughts of hurting or killing someone right NOW?" (e.g., yes, no, N/A)   - PLAN: "Do you have a specific plan for how you would do this?" (e.g., gun, knife, no plan, N/A)      no 5. FUNCTIONAL IMPAIRMENT: "How have things been going for you overall in your life? Have you had any more difficulties than usual doing your normal daily activities?"  (e.g., better, same, worse; self-care, school, work, interactions)     Feel robotic but doing what I have to do  6. SUPPORT: "Who is with you now?" "Who do you live with?" "Do you have family or friends nearby who you can talk to?"     Supportive husband, family and friends 7. THERAPIST: "Do you have a counselor or therapist? Name?"     *no 8. STRESSORS: "Has there been any new stress or recent changes in your life?"     Mothers death in September, sudden 9. DRUG ABUSE/ALCOHOL: "Do you drink alcohol or use any illegal drugs?"      no 10. OTHER: "Do you  have any other health or medical symptoms right now?" (e.g., fever)       decreased appetite  Protocols used: DEPRESSION-A-AH

## 2017-11-28 NOTE — Telephone Encounter (Signed)
  Answer Assessment - Initial Assessment Questions 1. CONCERN: "What happened that made you call today?"      2. DEPRESSION SYMPTOM SCREENING: "How are you feeling overall?" (e.g., decreased energy, increased sleeping or difficulty sleeping, difficulty concentrating, feelings of sadness, guilt, hopelessness, or worthlessness)     Difficulty sleeping, decreased energy, feeling of sadness 3. RISK OF HARM - SUICIDAL IDEATION:  "Do you ever have thoughts of hurting or killing yourself?"  (e.g., yes, no, no but preoccupation with thoughts about death)   - INTENT:  "Do you have thoughts of hurting or killing yourself right NOW?" (e.g., yes, no, N/A)   - PLAN: "Do you have a specific plan for how you would do this?" (e.g., gun, knife, overdose, no plan, N/A)     no 4. RISK OF HARM - HOMICIDAL IDEATION:  "Do you ever have thoughts of hurting or killing someone else?"  (e.g., yes, no, no but preoccupation with thoughts about death)   - INTENT:  "Do you have thoughts of hurting or killing someone right NOW?" (e.g., yes, no, N/A)   - PLAN: "Do you have a specific plan for how you would do this?" (e.g., gun, knife, no plan, N/A)      no 5. FUNCTIONAL IMPAIRMENT: "How have things been going for you overall in your life? Have you had any more difficulties than usual doing your normal daily activities?"  (e.g., better, same, worse; self-care, school, work, interactions)      6. SUPPORT: "Who is with you now?" "Who do you live with?" "Do you have family or friends nearby who you can talk to?"      Lots of support with family and friends 7. THERAPIST: "Do you have a counselor or therapist? Name?"     no 8. STRESSORS: "Has there been any new stress or recent changes in your life?"     *No Answer* 9. DRUG ABUSE/ALCOHOL: "Do you drink alcohol or use any illegal drugs?"      no 10. OTHER: "Do you have any other health or medical symptoms right now?" (e.g., fever)      Appetite poor  Protocols used:  DEPRESSION-A-AH

## 2017-11-28 NOTE — Telephone Encounter (Signed)
Pt reports increased depression x 2-3 weeks. States "In pain since June with back surgery; mother died suddenly in September." States is able to go about ADLs, is working, but feels "Robotic." No suicidal or homicidal ideation. Reports difficulty sleeping, feelings of sadness, decreased appetite. Pt has been on Zoloft "For a long time." Denies any other symptoms; no CP, SOB. Continues to have back pain.  States has supportive family , friends and coworkers. Appt made with L. Guse for Friday per pt's request, work schedule. Care advise given per protocol.

## 2017-11-29 DIAGNOSIS — M62838 Other muscle spasm: Secondary | ICD-10-CM | POA: Diagnosis not present

## 2017-11-29 DIAGNOSIS — M542 Cervicalgia: Secondary | ICD-10-CM | POA: Diagnosis not present

## 2017-11-30 ENCOUNTER — Ambulatory Visit: Payer: 59 | Admitting: Family Medicine

## 2017-11-30 ENCOUNTER — Encounter: Payer: Self-pay | Admitting: Family Medicine

## 2017-11-30 VITALS — BP 126/72 | HR 91 | Temp 98.5°F | Ht 63.0 in | Wt 181.6 lb

## 2017-11-30 DIAGNOSIS — Z981 Arthrodesis status: Secondary | ICD-10-CM

## 2017-11-30 DIAGNOSIS — M542 Cervicalgia: Secondary | ICD-10-CM | POA: Diagnosis not present

## 2017-11-30 DIAGNOSIS — Z634 Disappearance and death of family member: Secondary | ICD-10-CM | POA: Diagnosis not present

## 2017-11-30 DIAGNOSIS — F32A Depression, unspecified: Secondary | ICD-10-CM

## 2017-11-30 DIAGNOSIS — I10 Essential (primary) hypertension: Secondary | ICD-10-CM

## 2017-11-30 DIAGNOSIS — F329 Major depressive disorder, single episode, unspecified: Secondary | ICD-10-CM | POA: Diagnosis not present

## 2017-11-30 LAB — COMPREHENSIVE METABOLIC PANEL
ALT: 21 U/L (ref 0–35)
AST: 19 U/L (ref 0–37)
Albumin: 4.5 g/dL (ref 3.5–5.2)
Alkaline Phosphatase: 68 U/L (ref 39–117)
BILIRUBIN TOTAL: 0.8 mg/dL (ref 0.2–1.2)
BUN: 19 mg/dL (ref 6–23)
CHLORIDE: 106 meq/L (ref 96–112)
CO2: 29 meq/L (ref 19–32)
CREATININE: 1.11 mg/dL (ref 0.40–1.20)
Calcium: 9.7 mg/dL (ref 8.4–10.5)
GFR: 55.55 mL/min — AB (ref >60.00)
Glucose, Bld: 107 mg/dL — ABNORMAL HIGH (ref 70–99)
Potassium: 4.8 mEq/L (ref 3.5–5.1)
SODIUM: 141 meq/L (ref 135–145)
TOTAL PROTEIN: 7 g/dL (ref 6.0–8.3)

## 2017-11-30 MED ORDER — BUPROPION HCL ER (XL) 150 MG PO TB24: 150.0000 mg | ORAL_TABLET | Freq: Every day | ORAL | 2 refills | Status: DC

## 2017-11-30 NOTE — Progress Notes (Signed)
Subjective:    Patient ID: Jasmine Nolan, female    DOB: Nov 03, 1970, 47 y.o.   MRN: [id number]  HPI   Patient presents to clinic to discuss increased feelings of depression since loss of her mother in September 2019.  Patient states she has been trying to deal with the loss, but has been very hard.  States her husband is supportive, but she still does not feel like doing anything.  States she feels robotic, will get up in the morning go to work, go home and will just want to lay in bed.  Denies any suicidal ideation.  She currently does take Zoloft 100 mg/day, had felt stable on this medication in the past but it does not seem to be helping as much right now.  Blood pressure is greatly improved since change from lisinopril hydrochlorothiazide to losartan.  We will also plan to recheck metabolic panel today due to kidney function being decreased in October 2019.  Patient also continues to have neck pain.  She did have cervical fusion back in summer 2019, she has also been doing physical therapy regularly with dry needling.  She continues to have pain, states PT sessions will help for maybe 1 day, but then she will wake up with pain the next morning.  Patient states her physical therapist has reach out to her surgeon Dr. Wynetta Emery and has suggested that she may possibly need another MRI of neck.  Patient also feels that her feelings of depression are making her pain worse.  Patient Active Problem List   Diagnosis Date Noted  . Essential hypertension 10/12/2017  . History of fusion of cervical spine 04/16/2017  . Neck pain 01/15/2017  . Acute cystitis with hematuria 12/28/2016  . Acute non-recurrent maxillary sinusitis 05/19/2015  . Wheezing 10/02/2013  . Allergic rhinitis 10/02/2013  . Chest wall pain 09/04/2013  . Cough 08/13/2013  . Gout 10/04/2012  . Atypical chest pain 04/25/2012  . GERD (gastroesophageal reflux disease) 04/25/2012  . Panic attacks 10/11/2011  . Fatigue 07/26/2011  .  Cutaneous skin tags 06/01/2011  . Palpitations 01/26/2011  . Mitral valve prolapse 01/26/2011  . Inguinal mass 12/26/2010  . Exposure to STD 10/13/2010  . Obesity 06/06/2010  . Arthritis   . Depression    Social History   Tobacco Use  . Smoking status: Former Smoker    Packs/day: 0.50    Years: 20.00    Pack years: 10.00    Types: Cigarettes    Last attempt to quit: 2010    Years since quitting: 9.8  . Smokeless tobacco: Never Used  Substance Use Topics  . Alcohol use: Not Currently    Alcohol/week: 0.0 standard drinks    Frequency: Never    Comment: rarely   Review of Systems  Constitutional: Negative for chills, fatigue and fever.  HENT: Negative for congestion, ear pain, sinus pain and sore throat.   Eyes: Negative.   Respiratory: Negative for cough, shortness of breath and wheezing.   Cardiovascular: Negative for chest pain, palpitations and leg swelling.  Gastrointestinal: Negative for abdominal pain, diarrhea, nausea and vomiting.  Genitourinary: Negative for dysuria, frequency and urgency.  Musculoskeletal: +neck pain Skin: Negative for color change, pallor and rash.  Neurological: Negative for syncope, light-headedness and headaches.  Psychiatric/Behavioral: +feeling depressed, +loss of interest.        Objective:   Physical Exam  Constitutional: She is oriented to person, place, and time. No distress.  HENT:  Head: Normocephalic and atraumatic.  Eyes: Conjunctivae and EOM are normal. No scleral icterus.  Neck: Neck supple. No tracheal deviation present.  +C-spine pain  Cardiovascular: Normal rate, regular rhythm and normal heart sounds.  Pulmonary/Chest: Effort normal and breath sounds normal. No respiratory distress. She has no wheezes.  Neurological: She is alert and oriented to person, place, and time.  Skin: Skin is warm and dry. She is not diaphoretic.  Psychiatric: Judgment and thought content normal.  Tearful when talking about the loss of her  mom, her mom not being here for the holidays.   Nursing note and vitals reviewed.     Vitals:   11/30/17 0809  BP: 126/72  Pulse: 91  Temp: 98.5 F (36.9 C)  SpO2: 95%    Assessment & Plan:   Depression/bereavement - patient will continue Zoloft 100 mg/day.  We will also add on Wellbutrin 150 mg/day with hopes of improving overall mood.  Offered referral to counselor, but patient declines at this time.  Essential hypertension - blood pressure is very well controlled on losartan.  We will recheck CMP to follow-up on kidney functions.  Neck pain/history of cervical fusion - patient will continue to follow-up regularly with Dr. Wynetta Emeryram and physical therapy.  Patient also interested in pain management referral.  Currently she uses meloxicam and tizanidine to help control neck pain.  Patient is concerned these medications are hurting her liver.  CMP will also tell us liver functions.   Patient will follow-up in 3 to 4 weeks for recheck on mood after adding on Wellbutrin to medication regimen.

## 2017-12-03 ENCOUNTER — Telehealth: Payer: Self-pay

## 2017-12-03 NOTE — Telephone Encounter (Signed)
Great!

## 2017-12-03 NOTE — Telephone Encounter (Signed)
Copied from CRM [id number]. Topic: Quick Communication - Rx Refill/Question >> Dec 03, 2017  1:38 PM Crist InfanteHarrald, Kathy J wrote: Medication: buPROPion (WELLBUTRIN XL) 150 MG 24 hr tablet  Pt states this med is $122 for 30 days and she cannot afford. Anything else you can prescribe? Advised pt to call her insurance company to see what they recommend instead.    [id number] [place]- [place], [place] - [address] [facility] [address] [phone] (Phone) [phone] (Fax)

## 2017-12-03 NOTE — Telephone Encounter (Signed)
Prescription is covered under patient's insurance. They didn't run it under her insurance due to her last name being changed,

## 2017-12-03 NOTE — Telephone Encounter (Signed)
I am wondering of the non extended release version will be covered better? It would have to be taken 2x per day  Another alternative would be cymbalta 20 or 30mg  per day (cymbalta can help with pain relief too)  I can send in whatever will be cheaper for patient

## 2017-12-06 DIAGNOSIS — M542 Cervicalgia: Secondary | ICD-10-CM | POA: Diagnosis not present

## 2017-12-12 ENCOUNTER — Ambulatory Visit: Payer: 59 | Admitting: Family Medicine

## 2017-12-12 DIAGNOSIS — H524 Presbyopia: Secondary | ICD-10-CM | POA: Diagnosis not present

## 2017-12-25 ENCOUNTER — Other Ambulatory Visit: Payer: Self-pay | Admitting: Neurosurgery

## 2017-12-25 DIAGNOSIS — M542 Cervicalgia: Secondary | ICD-10-CM

## 2017-12-28 ENCOUNTER — Encounter: Payer: Self-pay | Admitting: Family Medicine

## 2017-12-28 ENCOUNTER — Ambulatory Visit: Payer: 59 | Admitting: Family Medicine

## 2017-12-28 VITALS — BP 118/82 | HR 89 | Temp 98.8°F | Ht 64.0 in | Wt 177.0 lb

## 2017-12-28 DIAGNOSIS — I10 Essential (primary) hypertension: Secondary | ICD-10-CM

## 2017-12-28 DIAGNOSIS — M542 Cervicalgia: Secondary | ICD-10-CM

## 2017-12-28 DIAGNOSIS — F32A Depression, unspecified: Secondary | ICD-10-CM

## 2017-12-28 DIAGNOSIS — F329 Major depressive disorder, single episode, unspecified: Secondary | ICD-10-CM

## 2017-12-28 DIAGNOSIS — Z634 Disappearance and death of family member: Secondary | ICD-10-CM | POA: Diagnosis not present

## 2017-12-28 MED ORDER — BUPROPION HCL ER (XL) 150 MG PO TB24: 150.0000 mg | ORAL_TABLET | Freq: Every day | ORAL | 2 refills | Status: DC

## 2017-12-28 NOTE — Progress Notes (Signed)
Subjective:    Patient ID: Jasmine Nolan, female    DOB: 02/14/1970, 47 y.o.   MRN: [id number]  HPI   Patient presents to clinic for follow-up on depression after adding Wellbutrin 150 onto her medication regimen.  Patient has taken sertraline 100 mg daily for many years, overall with good effect.  However a few months ago her mother died unexpectedly and this has been very difficult to deal with.  Patient's father also has past few years ago, so upcoming holidays have been making patient feel down due to missing her parents and knowing they will be here.  Patient states the addition of Wellbutrin overall has had some good effect, but she still has days where she feels down and is missing her parents.  Patient also has added stress in her life due to her daughter recently having some medical issues.   Patient has been tolerating BP meds with no issue.  Patient continues to have chronic neck pain.  She does have upcoming CT scan of neck planned for 16 December and then follow-up the next day with her neurosurgeon for discussion of possible future plans.  Patient is hoping she will not need another neck surgery.  Patient Active Problem List   Diagnosis Date Noted  . Bereavement 11/30/2017  . Essential hypertension 10/12/2017  . History of fusion of cervical spine 04/16/2017  . Neck pain 01/15/2017  . Acute cystitis with hematuria 12/28/2016  . Acute non-recurrent maxillary sinusitis 05/19/2015  . Wheezing 10/02/2013  . Allergic rhinitis 10/02/2013  . Chest wall pain 09/04/2013  . Cough 08/13/2013  . Gout 10/04/2012  . Atypical chest pain 04/25/2012  . GERD (gastroesophageal reflux disease) 04/25/2012  . Panic attacks 10/11/2011  . Fatigue 07/26/2011  . Cutaneous skin tags 06/01/2011  . Palpitations 01/26/2011  . Mitral valve prolapse 01/26/2011  . Inguinal mass 12/26/2010  . Exposure to STD 10/13/2010  . Obesity 06/06/2010  . Arthritis   . Depression    Social History   Tobacco  Use  . Smoking status: Former Smoker    Packs/day: 0.50    Years: 20.00    Pack years: 10.00    Types: Cigarettes    Last attempt to quit: 2010    Years since quitting: 9.9  . Smokeless tobacco: Never Used  Substance Use Topics  . Alcohol use: Not Currently    Alcohol/week: 0.0 standard drinks    Frequency: Never    Comment: rarely   Review of Systems   Constitutional: Negative for chills, fatigue and fever.  HENT: Negative for congestion, ear pain, sinus pain and sore throat.   Eyes: Negative.   Respiratory: Negative for cough, shortness of breath and wheezing.   Cardiovascular: Negative for chest pain, palpitations and leg swelling.  Gastrointestinal: Negative for abdominal pain, diarrhea, nausea and vomiting.  Genitourinary: Negative for dysuria, frequency and urgency.  Musculoskeletal: Negative for arthralgias and myalgias.  Skin: Negative for color change, pallor and rash.  Neurological: Negative for syncope, light-headedness and headaches.  Psychiatric/Behavioral: Has good and bad days, holidays will be tough     Objective:   Physical Exam Vitals signs and nursing note reviewed.  Constitutional:      Appearance: Normal appearance.  HENT:     Head: Normocephalic and atraumatic.     Mouth/Throat:     Mouth: Mucous membranes are moist.  Eyes:     General: No scleral icterus.    Extraocular Movements: Extraocular movements intact.     Conjunctiva/sclera:  Conjunctivae normal.  Neck:     Musculoskeletal: Neck supple. No neck rigidity.  Cardiovascular:     Rate and Rhythm: Normal rate and regular rhythm.  Pulmonary:     Effort: Pulmonary effort is normal. No respiratory distress.     Breath sounds: Normal breath sounds.  Skin:    General: Skin is warm.  Neurological:     General: No focal deficit present.     Mental Status: She is alert and oriented to person, place, and time.  Psychiatric:        Mood and Affect: Mood normal.        Behavior: Behavior normal.         Thought Content: Thought content normal.        Judgment: Judgment normal.     Comments: No SI or HI       Vitals:   12/28/17 0809  BP: 118/82  Pulse: 89  Temp: 98.8 F (37.1 C)  SpO2: 97%   BP Readings from Last 3 Encounters:  12/28/17 118/82  11/30/17 126/72  10/11/17 (!) 148/100   Assessment & Plan:   Depression/bereavement- overall patient does notice some effect from the Wellbutrin in addition to the Zoloft.  Patient would like to continue Wellbutrin at 150 mg/day.  Discussed also option of putting in a counseling referral, patient would prefer to not do counseling at this time.  Patient plans to see how the holidays go.  Patient states she will think about counseling and if she decides she does want referral, she will call office and let us know.  Essential hypertension-blood pressure remained stable on current medications.  Neck pain- patient will continue to keep appointments as planned with neurosurgeon.  Reassured patient that if the neurosurgeon believes she does need another neck surgery, then he is doing everything he can to help her pain to be improved.  Patient understands, she is just not looking forward to the possibility of needing another surgery.  Patient will follow-up here in approximately 2 months for recheck on chronic medical conditions.  She is aware she can return to clinic sooner if any issues arise.

## 2017-12-31 ENCOUNTER — Ambulatory Visit
Admission: RE | Admit: 2017-12-31 | Discharge: 2017-12-31 | Disposition: A | Discharge status: Home / Self Care | Payer: 59 | Source: Ambulatory Visit | Attending: Neurosurgery | Admitting: Neurosurgery

## 2017-12-31 DIAGNOSIS — M542 Cervicalgia: Secondary | ICD-10-CM

## 2018-01-01 DIAGNOSIS — M542 Cervicalgia: Secondary | ICD-10-CM | POA: Diagnosis not present

## 2018-02-02 DIAGNOSIS — M47812 Spondylosis without myelopathy or radiculopathy, cervical region: Secondary | ICD-10-CM | POA: Diagnosis not present

## 2018-02-11 DIAGNOSIS — G4733 Obstructive sleep apnea (adult) (pediatric): Secondary | ICD-10-CM | POA: Diagnosis not present

## 2018-02-12 DIAGNOSIS — M542 Cervicalgia: Secondary | ICD-10-CM | POA: Diagnosis not present

## 2018-02-12 DIAGNOSIS — M5412 Radiculopathy, cervical region: Secondary | ICD-10-CM | POA: Diagnosis not present

## 2018-03-01 ENCOUNTER — Other Ambulatory Visit: Payer: Self-pay | Admitting: Family Medicine

## 2018-03-01 DIAGNOSIS — I10 Essential (primary) hypertension: Secondary | ICD-10-CM

## 2018-03-05 DIAGNOSIS — M47812 Spondylosis without myelopathy or radiculopathy, cervical region: Secondary | ICD-10-CM | POA: Diagnosis not present

## 2018-03-06 ENCOUNTER — Ambulatory Visit: Payer: 59 | Admitting: Family Medicine

## 2018-03-06 ENCOUNTER — Encounter: Payer: Self-pay | Admitting: Family Medicine

## 2018-03-06 VITALS — BP 112/78 | HR 92 | Temp 98.4°F | Resp 16 | Ht 64.0 in | Wt 178.0 lb

## 2018-03-06 DIAGNOSIS — M542 Cervicalgia: Secondary | ICD-10-CM | POA: Diagnosis not present

## 2018-03-06 DIAGNOSIS — I10 Essential (primary) hypertension: Secondary | ICD-10-CM | POA: Diagnosis not present

## 2018-03-06 DIAGNOSIS — F32A Depression, unspecified: Secondary | ICD-10-CM

## 2018-03-06 DIAGNOSIS — F329 Major depressive disorder, single episode, unspecified: Secondary | ICD-10-CM | POA: Diagnosis not present

## 2018-03-06 DIAGNOSIS — Z634 Disappearance and death of family member: Secondary | ICD-10-CM

## 2018-03-06 NOTE — Progress Notes (Signed)
Subjective:    Patient ID: Jasmine Nolan, female    DOB: [birth year], 48 y.o.   MRN: [id number]  HPI   Patient presents to clinic for follow-up on blood pressure, depression grieving due to loss of hearing, and neck pain.  Tolerating her blood pressure medication without any issues.  Denies any chest pain, lower extremity swelling, cough or palpitations.  Patient takes Zoloft 200 mg once per day for depression, has been on this for many years.  Wellbutrin was added due to having a more difficult time dealing with the loss of her parent.  Patient states that about a few weeks ago he noticed herself feeling jittery and on edge, stopped the Wellbutrin.  Since stopping the Wellbutrin she feels that her mood is back to normal with just being on the Zoloft.  Denies any SI or HI.  Denies any breakthrough anxiety or crying episodes.  States "I feel like I am getting back to myself again".  Patient also is interviewing for new jobs, has decided she is no longer happy where she currently is working and is looking for other opportunities, this process of interviewing and looking for a new job as been going well so far.  Patient is neck pain is improved.  She did get injections via her spine specialist.  States her range of motion feels more fluid, and neck does not ache as much as it was before.  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  Patient Active Problem List   Diagnosis Date Noted  . Bereavement 11/30/2017  . Essential hypertension 10/12/2017  . History of fusion of cervical spine 04/16/2017  . Neck pain 01/15/2017  . Acute cystitis with hematuria 12/28/2016  . Acute non-recurrent maxillary sinusitis 05/19/2015  . Wheezing 10/02/2013  . Allergic rhinitis 10/02/2013  . Chest wall pain 09/04/2013  . Cough 08/13/2013  . Gout  10/04/2012  . Atypical chest pain 04/25/2012  . GERD (gastroesophageal reflux disease) 04/25/2012  . Panic attacks 10/11/2011  . Fatigue 07/26/2011  . Cutaneous skin tags 06/01/2011  . Palpitations 01/26/2011  . Mitral valve prolapse 01/26/2011  . Inguinal mass 12/26/2010  . Exposure to STD 10/13/2010  . Obesity 06/06/2010  . Arthritis   . Depression    Social History   Tobacco Use  . Smoking status: Former Smoker    Packs/day: 0.50    Years: 20.00    Pack years: 10.00    Types: Cigarettes    Last attempt to quit: 2010    Years since quitting: 10.1  . Smokeless tobacco: Never Used  Substance Use Topics  . Alcohol use: Not Currently    Alcohol/week: 0.0 standard drinks    Frequency: Never    Comment: rarely   Review of Systems  Constitutional: Negative for chills, fatigue and fever.  HENT: Negative for congestion, ear pain, sinus pain and sore throat.   Eyes: Negative.   Respiratory: Negative for cough, shortness of breath and wheezing.   Cardiovascular: Negative for chest pain, palpitations and leg swelling.  Gastrointestinal: Negative for abdominal pain, diarrhea, nausea and vomiting.  Genitourinary: Negative for dysuria, frequency and urgency.  Musculoskeletal: Negative for arthralgias and myalgias. Neck pain improved after injections.  Skin: Negative for color change, pallor and rash.  Neurological: Negative for syncope, light-headedness and headaches.  Psychiatric/Behavioral:  The patient is not nervous/anxious.       Objective:   Physical Exam  Constitutional: She appears well-developed and well-nourished. No distress.  Well-groomed. HENT:  Head: Normocephalic and atraumatic.  Eyes: Pupils are equal, round, and reactive to light. EOM are normal. No scleral icterus.  Neck: Normal range of motion. Neck supple. No tracheal deviation present.  Cardiovascular: Normal rate, regular rhythm and normal heart sounds.  Pulmonary/Chest: Effort normal and breath sounds  normal. No respiratory distress. She has no wheezes. She has no rales.  Abdominal: Soft. Bowel sounds are normal. There is no tenderness.  Neurological: She is alert and oriented to person, place, and time.  Gait normal  Skin: Skin is warm and dry. No pallor.  Psychiatric: She has a normal mood and affect. Her behavior is normal. Thought content normal.  Good eye contact.  Can clearly express thoughts. Nursing note and vitals reviewed.   Vitals:   03/06/18 0808  BP: 112/78  Pulse: 92  Resp: 16  Temp: 98.4 F (36.9 C)  SpO2: 98%       Assessment & Plan:    A total of 25  minutes were spent face-to-face with the patient during this encounter and over half of that time was spent on counseling and coordination of care. The patient was counseled on mood, depression, bereavement.   Essential hypertension- blood pressure looks wonderful today.  She will continue current medications.  She will continue to follow healthy diet with lean proteins, lots of vegetables and do regular physical activity.  Bereavement/depression-patient feels she is overall feeling better with the loss of her mother.  She has stopped Wellbutrin on her own due to it making her feel a little bit more on edge over the past few weeks.  She continues to take Zoloft 100 mg daily and does feel well on this dose.    Neck pain- patient had injections in her neck at spine specialist.  CT scan that she had done did reveal arthritis in the C1 & C7 area.  Due to her past surgical history, injections were decided to be the best route with pain control.  Patient states her neck pain is improved since having the injections a few days ago.  Overall she is pleased with the results of injections  Patient will follow-up here in 3 months for recheck of chronic medical conditions.  She is aware she can return to clinic sooner if any issues arise.

## 2018-03-12 DIAGNOSIS — Z01419 Encounter for gynecological examination (general) (routine) without abnormal findings: Secondary | ICD-10-CM | POA: Diagnosis not present

## 2018-03-12 DIAGNOSIS — Z6830 Body mass index (BMI) 30.0-30.9, adult: Secondary | ICD-10-CM | POA: Diagnosis not present

## 2018-03-14 DIAGNOSIS — M5412 Radiculopathy, cervical region: Secondary | ICD-10-CM | POA: Diagnosis not present

## 2018-06-24 ENCOUNTER — Other Ambulatory Visit: Payer: Self-pay | Admitting: Family Medicine

## 2018-06-24 DIAGNOSIS — I10 Essential (primary) hypertension: Secondary | ICD-10-CM

## 2018-09-27 ENCOUNTER — Other Ambulatory Visit: Payer: Self-pay

## 2018-09-27 ENCOUNTER — Telehealth: Payer: Self-pay | Admitting: Family Medicine

## 2018-09-27 ENCOUNTER — Ambulatory Visit: Payer: 59 | Admitting: Family Medicine

## 2018-09-27 ENCOUNTER — Ambulatory Visit (INDEPENDENT_AMBULATORY_CARE_PROVIDER_SITE_OTHER): Payer: 59 | Admitting: Family Medicine

## 2018-09-27 ENCOUNTER — Encounter: Payer: Self-pay | Admitting: Family Medicine

## 2018-09-27 ENCOUNTER — Other Ambulatory Visit: Payer: Self-pay | Admitting: *Deleted

## 2018-09-27 VITALS — Ht 64.0 in | Wt 170.0 lb

## 2018-09-27 DIAGNOSIS — Z20822 Contact with and (suspected) exposure to covid-19: Secondary | ICD-10-CM

## 2018-09-27 DIAGNOSIS — R51 Headache: Secondary | ICD-10-CM | POA: Diagnosis not present

## 2018-09-27 NOTE — Telephone Encounter (Signed)
ordered

## 2018-09-27 NOTE — Progress Notes (Signed)
Virtual Visit via video Note  This visit type was conducted due to national recommendations for restrictions regarding the COVID-19 pandemic (e.g. social distancing).  This format is felt to be most appropriate for this patient at this time.  All issues noted in this document were discussed and addressed.  No physical exam was performed (except for noted visual exam findings with Video Visits).   I connected with Jasmine Nolan today at 10:30 AM EDT by a video enabled telemedicine application and verified that I am speaking with the correct person using two identifiers. Location patient: home Location provider: work Persons participating in the virtual visit: patient, provider  I discussed the limitations, risks, security and privacy concerns of performing an evaluation and management service by telephone and the availability of in person appointments. I also discussed with the patient that there may be a patient responsible charge related to this service. The patient expressed understanding and agreed to proceed.   Reason for visit: same day visit  HPI: Upper respiratory symptoms: Patient notes onset of symptoms 3 days ago.  She has had headache, ear pain, sneezing, nasal congestion, sore throat, and mild cough with no production.  T-max 99.7 F.  No COVID-19 exposure.  No sick contacts.  No shortness of breath.  She is tried DayQuil, NyQuil, Flonase, Claritin, ibuprofen, and Tylenol.  She works from home and the only place she has gone is [place] [employer].   ROS: See pertinent positives and negatives per HPI.  Past Medical History:  Diagnosis Date  . Anxiety   . Arthritis   . Depression   . Gout   . IBS (irritable bowel syndrome)   . Inguinal mass   . Internal hemorrhoids   . Lateral epicondylitis of elbow   . MVP (mitral valve prolapse)   . Obesity   . Pneumonia   . Sinusitis     Past Surgical History:  Procedure Laterality Date  . CERVICAL FUSION  2011   Cram, 2 level   . CESAREAN SECTION  2001  . ENDOMETRIAL ABLATION    . LUMBAR DISC SURGERY  June 2011  . SPINE SURGERY    . TUBAL LIGATION      Family History  Problem Relation Age of Onset  . Colon cancer Father 7859       died of colon CA  . Depression Mother   . Arthritis Mother   . Heart disease Mother        Enlarged heart  . Heart murmur Brother   . Arrhythmia Maternal Grandmother   . Breast cancer Neg Hx     SOCIAL HX: Former smoker   Current Outpatient Medications:  .  Diclofenac Sodium (PENNSAID) 2 % SOLN, Place 1 application onto the skin 2 (two) times daily., Disp: 1 Bottle, Rfl: 3 .  gabapentin (NEURONTIN) 100 MG capsule, , Disp: , Rfl:  .  gabapentin (NEURONTIN) 100 MG capsule, TK 1 C PO QD HS, Disp: , Rfl:  .  HYDROmorphone (DILAUDID) 2 MG tablet, TK 1 T PO QD HS, Disp: , Rfl:  .  losartan (COZAAR) 50 MG tablet, TAKE 1 TABLET(50 MG) BY MOUTH DAILY, Disp: 30 tablet, Rfl: 3 .  meloxicam (MOBIC) 7.5 MG tablet, TK 1 T PO BID, Disp: , Rfl: 1 .  omeprazole (PRILOSEC) 20 MG capsule, Take 20 mg by mouth at bedtime. Reported on 05/01/2015, Disp: , Rfl:  .  sertraline (ZOLOFT) 100 MG tablet, Take 200 mg by mouth at bedtime. ,  Disp: , Rfl:  .  tiZANidine (ZANAFLEX) 4 MG capsule, TK 1 C PO BID, Disp: , Rfl: 0  EXAM:  VITALS per patient if applicable: None.  GENERAL: alert, oriented, appears well and in no acute distress  HEENT: atraumatic, conjunttiva clear, no obvious abnormalities on inspection of external nose and ears  NECK: normal movements of the head and neck  LUNGS: on inspection no signs of respiratory distress, breathing rate appears normal, no obvious gross SOB, gasping or wheezing  CV: no obvious cyanosis  MS: moves all visible extremities without noticeable abnormality   ASSESSMENT AND PLAN:  Discussed the following assessment and plan:  Suspected Covid-19 Virus Infection Patient with symptoms of upper respiratory infection that could indicate COVID-19 infection.   Discussed testing at the [facility] location.  Discussed that it can be 2 to 7 days prior to the test resulting.  Advised to wear a mask.  Discussed strict quarantine precautions for her to last until at least the test result returns and we release her for quarantine.  Advise quarantine for her family as well at least until her test result returns.  Discussed that if they were unable to quarantine they should wear masks and check temperatures.  She can continue supportive measures.  I will have the CMA contact the patient to see if she would like to participate in the [place] home monitoring program for COVID-19.     I discussed the assessment and treatment plan with the patient. The patient was provided an opportunity to ask questions and all were answered. The patient agreed with the plan and demonstrated an understanding of the instructions.   The patient was advised to call back or seek an in-person evaluation if the symptoms worsen or if the condition fails to improve as anticipated.   Tommi Rumps, MD

## 2018-09-27 NOTE — Assessment & Plan Note (Addendum)
Patient with symptoms of upper respiratory infection that could indicate COVID-19 infection.  Discussed testing at the [facility] location.  Discussed that it can be 2 to 7 days prior to the test resulting.  Advised to wear a mask.  Discussed strict quarantine precautions for her to last until at least the test result returns and we release her for quarantine.  Advise quarantine for her family as well at least until her test result returns.  Discussed that if they were unable to quarantine they should wear masks and check temperatures.  She can continue supportive measures.  I will have the CMA contact the patient to see if she would like to participate in the [facility] home monitoring program for COVID-19.

## 2018-09-27 NOTE — Telephone Encounter (Signed)
Please contact the patient and see if she would like to participate in the COVID-19 home monitoring program through my chart.  I can place this order if she is willing to do this.

## 2018-09-27 NOTE — Addendum Note (Signed)
Addended by: Philis Nettle on: 09/27/2018 02:46 PM   Modules accepted: Orders

## 2018-09-27 NOTE — Telephone Encounter (Signed)
I called  The patient and she agreed to the COVID-19 home monitoring program.  Nina,cma

## 2018-09-29 LAB — NOVEL CORONAVIRUS, NAA: SARS-CoV-2, NAA: NOT DETECTED

## 2018-10-01 ENCOUNTER — Encounter: Payer: Self-pay | Admitting: *Deleted

## 2018-10-17 ENCOUNTER — Other Ambulatory Visit: Payer: Self-pay | Admitting: Family Medicine

## 2018-10-17 DIAGNOSIS — Z1231 Encounter for screening mammogram for malignant neoplasm of breast: Secondary | ICD-10-CM

## 2018-10-28 ENCOUNTER — Ambulatory Visit
Admission: RE | Admit: 2018-10-28 | Discharge: 2018-10-28 | Disposition: A | Discharge status: Home / Self Care | Payer: 59 | Source: Ambulatory Visit | Attending: Family Medicine | Admitting: Family Medicine

## 2018-10-28 ENCOUNTER — Other Ambulatory Visit: Payer: Self-pay

## 2018-10-28 DIAGNOSIS — Z1231 Encounter for screening mammogram for malignant neoplasm of breast: Secondary | ICD-10-CM

## 2018-11-05 ENCOUNTER — Other Ambulatory Visit: Payer: Self-pay | Admitting: Lab

## 2018-11-05 DIAGNOSIS — I10 Essential (primary) hypertension: Secondary | ICD-10-CM

## 2018-11-05 MED ORDER — LOSARTAN POTASSIUM 50 MG PO TABS: ORAL_TABLET | ORAL | 3 refills | Status: AC

## 2018-11-22 ENCOUNTER — Telehealth: Payer: Self-pay

## 2018-11-22 ENCOUNTER — Encounter: Payer: Self-pay | Admitting: Family Medicine

## 2018-11-22 ENCOUNTER — Telehealth: Payer: Self-pay | Admitting: *Deleted

## 2018-11-22 NOTE — Telephone Encounter (Signed)
I am sorry but I am not accepting new patients as I am leaving the organization in a few months.

## 2018-11-22 NOTE — Telephone Encounter (Signed)
Copied from [place] [id number]. Topic: Appointment Scheduling - Transfer of Care >> Nov 22, 2018  9:08 AM Lennox Solders wrote: Pt is requesting to transfer FROM:lauren guse Pt is requesting to transfer TO:dr aron Reason for requested transfer: lauren guse is leaving and she use to be a pt on dr Deborra Medina Send CRM to patient's current PCP (transferring FROM).

## 2018-11-22 NOTE — Telephone Encounter (Signed)
Called and gave pt info.. pt said that she will call back when she decides if and who she wants to see.

## 2018-11-22 NOTE — Telephone Encounter (Signed)
Please see message and advise.  Thank you. Are you willing to establish pt?

## 2018-11-22 NOTE — Telephone Encounter (Signed)
Copied from [place] [phone]. Topic: General - Inquiry >> Nov 22, 2018 11:24 AM Mathis Bud wrote: Reason for CRM: Patient called leaving message for Dr.Aron, patient would like to know if Deborra Medina will take her back.   Call back [phone]

## 2018-11-24 NOTE — Telephone Encounter (Signed)
I will no longer be working in this practice in less than 3 months, so I am sorry but I cannot accept new patients.

## 2018-11-25 NOTE — Telephone Encounter (Signed)
I spoke with pt and informed her of message from [place].  Pt said that she would give a thought to whom she might want to see here and call office back.

## 2018-12-20 ENCOUNTER — Encounter: Payer: 59 | Admitting: Nurse Practitioner

## 2019-05-30 ENCOUNTER — Encounter: Payer: Self-pay | Admitting: Internal Medicine

## 2019-05-30 ENCOUNTER — Ambulatory Visit (INDEPENDENT_AMBULATORY_CARE_PROVIDER_SITE_OTHER): Payer: PRIVATE HEALTH INSURANCE

## 2019-05-30 ENCOUNTER — Other Ambulatory Visit: Payer: Self-pay

## 2019-05-30 ENCOUNTER — Ambulatory Visit (INDEPENDENT_AMBULATORY_CARE_PROVIDER_SITE_OTHER): Payer: PRIVATE HEALTH INSURANCE | Admitting: Internal Medicine

## 2019-05-30 VITALS — BP 134/96 | HR 76 | Ht 63.0 in | Wt 180.1 lb

## 2019-05-30 DIAGNOSIS — E782 Mixed hyperlipidemia: Secondary | ICD-10-CM | POA: Diagnosis not present

## 2019-05-30 DIAGNOSIS — R0609 Other forms of dyspnea: Secondary | ICD-10-CM | POA: Insufficient documentation

## 2019-05-30 DIAGNOSIS — R079 Chest pain, unspecified: Secondary | ICD-10-CM

## 2019-05-30 DIAGNOSIS — R002 Palpitations: Secondary | ICD-10-CM

## 2019-05-30 DIAGNOSIS — R0600 Dyspnea, unspecified: Secondary | ICD-10-CM | POA: Diagnosis not present

## 2019-05-30 DIAGNOSIS — I10 Essential (primary) hypertension: Secondary | ICD-10-CM

## 2019-05-30 MED ORDER — METOPROLOL TARTRATE 25 MG PO TABS: ORAL_TABLET | ORAL | 0 refills | Status: DC

## 2019-05-30 NOTE — Patient Instructions (Signed)
Medication Instructions:  Your physician recommends that you continue on your current medications as directed. Please refer to the Current Medication list given to you today.   A one time dose of Metoprolol 25 mg has been sent to your pharmacy to be taken 2 hours prior to your Cardiac CT.  *If you need a refill on your cardiac medications before your next appointment, please call your pharmacy*   Lab Work: None ordered If you have labs (blood work) drawn today and your tests are completely normal, you will receive your results only by: Marland Kitchen MyChart Message (if you have MyChart) OR . A paper copy in the mail If you have any lab test that is abnormal or we need to change your treatment, we will call you to review the results.   Testing/Procedures: Your physician has requested that you have an echocardiogram. Echocardiography is a painless test that uses sound waves to create images of your heart. It provides your doctor with information about the size and shape of your heart and how well your heart's chambers and valves are working. This procedure takes approximately one hour. There are no restrictions for this procedure. (To be scheduled in [place])  Your physician has requested that you have cardiac CT. Cardiac computed tomography (CT) is a painless test that uses an x-ray machine to take clear, detailed pictures of your heart. For further information please visit [email or web address]. Please follow instruction sheet as given.   Your physician has recommended that you wear a Zio monitor. (To be worn for 14 days) This monitor is a medical device that records the heart's electrical activity. Doctors most often use these monitors to diagnose arrhythmias. Arrhythmias are problems with the speed or rhythm of the heartbeat. The monitor is a small device applied to your chest. You can wear one while you do your normal daily activities. While wearing this monitor if you have any symptoms to push the  button and record what you felt. Once you have worn this monitor for the period of time provider prescribed (Usually 14 days), you will return the monitor device in the postage paid box. Once it is returned they will download the data collected and provide [place] with a report which the provider will then review and we will call you with those results. Important tips:  1. Avoid showering during the first 24 hours of wearing the monitor. 2. Avoid excessive sweating to help maximize wear time. 3. Do not submerge the device, no hot tubs, and no swimming pools. 4. Keep any lotions or oils away from the patch. 5. After 24 hours you may shower with the patch on. Take brief showers with your back facing the shower head.  6. Do not remove patch once it has been placed because that will interrupt data and decrease adhesive wear time. 7. Push the button when you have any symptoms and write down what you were feeling. 8. Once you have completed wearing your monitor, remove and place into box which has postage paid and place in your outgoing mailbox.  9. If for some reason you have misplaced your box then call our office and we can provide another box and/or mail it off for you.          Follow-Up: At [facility], you and your health needs are our priority.  As part of our continuing mission to provide you with exceptional heart care, we have created designated Provider Care Teams.  These Care Teams include your primary Cardiologist (  physician) and Advanced Practice Providers (APPs -  Physician Assistants and Nurse Practitioners) who all work together to provide you with the care you need, when you need it.  We recommend signing up for the patient portal called "MyChart".  Sign up information is provided on this After Visit Summary.  MyChart is used to connect with patients for Virtual Visits (Telemedicine).  Patients are able to view lab/test results, encounter notes, upcoming appointments, etc.   Non-urgent messages can be sent to your provider as well.   To learn more about what you can do with MyChart, go to [email or web address].    Your next appointment:   2 month(s)  The format for your next appointment:   In Person  Provider:    You may see Nelva Bush, MD or one of the following Advanced Practice Providers on your designated Care Team:    Murray Hodgkins, NP  Christell Faith, PA-C  Marrianne Mood, PA-C    Other Instructions Your cardiac CT will be scheduled at one of the below locations:   [facility] [address] [place], [place] [address] [phone]  [facility] [facility] [address] [place], [place] [address] [phone]  If scheduled at [facility] ([facility]), please arrive at the [facility] main entrance of [facility] 30 minutes prior to test start time. Proceed to the [facility] Radiology Department (first floor) to check-in and test prep.  If scheduled at [facility], please arrive 15 mins early for check-in and test prep.  Please follow these instructions carefully (unless otherwise directed):   On the Night Before the Test: . Be sure to Drink plenty of water. . Do not consume any caffeinated/decaffeinated beverages or chocolate 12 hours prior to your test. . Do not take any antihistamines 12 hours prior to your test. I On the Day of the Test: . Drink plenty of water. Do not drink any water within one hour of the test. . Do not eat any food 4 hours prior to the test. . You may take your regular medications prior to the test.  . Take metoprolol (Lopressor) two hours prior to test. . FEMALES- please wear underwire-free bra if available        After the Test: . Drink plenty of water. . After receiving IV contrast, you may experience a mild flushed feeling. This is normal. . On occasion, you may experience a mild rash up to 24 hours after  the test. This is not dangerous. If this occurs, you can take Benadryl 25 mg and increase your fluid intake. . If you experience trouble breathing, this can be serious. If it is severe call 911 IMMEDIATELY. If it is mild, please call our office. . If you take any of these medications: Glipizide/Metformin, Avandament, Glucavance, please do not take 48 hours after completing test unless otherwise instructed.   Once we have confirmed authorization from your insurance company, we will call you to set up a date and time for your test.   For non-scheduling related questions, please contact the cardiac imaging nurse navigator should you have any questions/concerns: Marchia Bond, Cardiac Imaging Nurse Navigator Burley Saver, Interim Cardiac Imaging Nurse [place] [facility] Direct Office Dial: [phone]   For scheduling needs, including cancellations and rescheduling, please call [phone].

## 2019-05-30 NOTE — Progress Notes (Signed)
Follow-up Outpatient Visit Date: 05/30/2019  Primary Care Provider: Agnes Lawrence [address] [place] [address]  Chief Complaint: Palpitations, chest pain, and shortness of breath  HPI:  Ms. Leib is a 49 y.o. female with history of mitral valve prolapse, hypertension, hyperlipidemia, IBS, depression, anxiety, gout, and obesity, who presents for follow-up of palpitations and mitral valve prolapse.  I last saw her in 06/2017, at which time Ms. Handyside reported intermittent pain in the left neck and arm with associated paresthesias related to cervical spine disease.  She was scheduled for cervical spine surgery the following week.  Sporadic palpitations with vague chest pain that has been present since her twenties were unchanged.  We did not make any medication changes or pursue further testing at that time.  Today, Ms. Rennie reports that she has been experiencing more frequent palpitations over the last few weeks.  They are now happening daily and are associated with transient chest pain that radiates through to her back and under the left arm.  She does not have exertional chest pain or palpitations but has experienced increased dyspnea with modest activity.  She notes having been sedentary following her neck surgery, which has been complicated by chronic pain.  She denies lightheadedness and edema.  Ms. Henle reports that she was prescribed propranolol many years ago and experienced marked bradycardia leading to an ED visit at that time.  She does not recall the dose and does not believe she has been rechallenged with a beta-blocker since then.  She is also concerned because her mother died almost 2 years ago of "cardiac arrest" though she did not have a history of heart problems.  --------------------------------------------------------------------------------------------------  Cardiovascular History & Procedures: Cardiovascular Problems:  Mitral valve  prolapse  Palpitations  Atypical chest pain  Dyspnea on exertion  Risk Factors:  Obesity, hypertension, and hyperlipidemia  Cath/PCI:  None  CV Surgery:  None  EP Procedures and Devices:  Patient reports wearing Holter monitor in the remote past and thinks that showed PVCs.  Non-Invasive Evaluation(s):  TTE (05/22/16):Normal LV size and contraction with LVEF of 55-60%. There are no regional wall motion abnormalities. Grade 1 diastolic dysfunction is noted. Normal RV size and function. No significant valvular abnormalities. A trivial pericardial effusionispresent. Normal pulmonary artery pressure.  Recent CV Pertinent Labs: Lab Results  Component Value Date   CHOL 231 (H) 10/18/2017   HDL 36.30 (L) 10/18/2017   LDLDIRECT 159.0 10/18/2017   TRIG 263.0 (H) 10/18/2017   CHOLHDL 6 10/18/2017   K 4.8 11/30/2017   MG 1.9 04/13/2016   BUN 19 11/30/2017   CREATININE 1.11 11/30/2017    Past medical and surgical history were reviewed and updated in EPIC.  Current Meds  Medication Sig  . baclofen (LIORESAL) 10 MG tablet Take 10 mg by mouth at bedtime.  . Diclofenac Sodium (PENNSAID) 2 % SOLN Place 1 application onto the skin 2 (two) times daily.  Marland Kitchen gabapentin (NEURONTIN) 100 MG capsule   . gabapentin (NEURONTIN) 100 MG capsule TK 1 C PO QD HS  . HYDROmorphone (DILAUDID) 2 MG tablet 4 mg.   . losartan (COZAAR) 50 MG tablet TAKE 1 TABLET(50 MG) BY MOUTH DAILY  . omeprazole (PRILOSEC) 20 MG capsule Take 20 mg by mouth at bedtime. Reported on 05/01/2015  . sertraline (ZOLOFT) 100 MG tablet Take 200 mg by mouth at bedtime.     Allergies: Bactrim, Dicyclomine hcl, and Tetanus toxoids  Social History   Tobacco Use  .  Smoking status: Former Smoker    Packs/day: 0.50    Years: 20.00    Pack years: 10.00    Types: Cigarettes    Quit date: 2010    Years since quitting: 11.3  . Smokeless tobacco: Never Used  Substance Use Topics  . Alcohol use: Not Currently     Alcohol/week: 0.0 standard drinks    Comment: rarely  . Drug use: No    Family History  Problem Relation Age of Onset  . Colon cancer Father 97       died of colon CA  . Depression Mother   . Arthritis Mother   . Heart disease Mother        Enlarged heart  . Heart murmur Brother   . Arrhythmia Maternal Grandmother   . Breast cancer Maternal Aunt     Review of Systems: A 12-system review of systems was performed and was negative except as noted in the HPI.  --------------------------------------------------------------------------------------------------  Physical Exam: BP (!) 134/96 (BP Location: Left Arm, Patient Position: Sitting, Cuff Size: Normal)   Pulse 76   Ht 5\' 3"  (1.6 m)   Wt 180 lb 2 oz (81.7 kg)   SpO2 98%   BMI 31.91 kg/m   General: NAD. Neck: No JVD or HJR. Lungs: Clear to auscultation without wheezes or crackles. Heart: Regular rate and rhythm without murmurs or rubs.  Midsystolic click is noted. Abdomen: Soft, nontender, nondistended. Extremities: No lower extremity edema.  EKG: Normal sinus rhythm without abnormality.  Lab Results  Component Value Date   WBC 7.1 10/18/2017   HGB 14.6 10/18/2017   HCT 42.6 10/18/2017   MCV 82.4 10/18/2017   PLT 193.0 10/18/2017    Lab Results  Component Value Date   NA 141 11/30/2017   K 4.8 11/30/2017   CL 106 11/30/2017   CO2 29 11/30/2017   BUN 19 11/30/2017   CREATININE 1.11 11/30/2017   GLUCOSE 107 (H) 11/30/2017   ALT 21 11/30/2017    Lab Results  Component Value Date   CHOL 231 (H) 10/18/2017   HDL 36.30 (L) 10/18/2017   LDLDIRECT 159.0 10/18/2017   TRIG 263.0 (H) 10/18/2017   CHOLHDL 6 10/18/2017   Outside Labs:  Lipid Panel completed 03/17/2019 HDL 41.000 03/17/2019 LDL-C 05/17/2019 03/17/2019 Cholesterol, total 249.000 03/17/2019 Triglycerides 214.000 03/17/2019  Glucose Random 118.000 05/23/2019  BUN 17.000 05/23/2019 Creatinine, Serum 1.020 05/23/2019  TSH 1.840  05/23/2019  --------------------------------------------------------------------------------------------------  ASSESSMENT AND PLAN: Chest pain, dyspnea on exertion, and palpitations with history of mitral valve prolapse: Ms. Abend has a long history of palpitations and atypical chest pain, though her symptoms seem to have worsened over the last few months.  She also reports worsening exertional dyspnea.  Examination and EKG today are unrevealing other than a midsystolic click consistent with her history of MVP.  She is quite concerned about worsening heart disease following her mother's passing in 2019 due to "cardiac arrest."  We have agreed to obtain a transthoracic echocardiogram and 14-day event monitor for further assessment of her palpitations and associated chest discomfort in the setting of mitral valve prolapse.  We will also obtain a cardiac CTA.  Given history of bradycardia with propranolol use in the past, will I have recommended taking metoprolol tartrate 25 mg x 1 shortly before CTA.  Hyperlipidemia: Recent outside labs notable for significant elevation in LDL (168).  Based on results of aforementioned testing, statin therapy may be indicated.  For now, we will work on lifestyle modifications.  Hypertension: Diastolic blood pressure mildly elevated today.  Ms. Somes should continue with losartan and ongoing management per her PCP.  Follow-up: Return to clinic in 2 months.  Nelva Bush, MD 05/30/2019 8:31 AM

## 2019-06-19 ENCOUNTER — Other Ambulatory Visit: Payer: Self-pay

## 2019-06-19 ENCOUNTER — Ambulatory Visit (HOSPITAL_COMMUNITY): Payer: PRIVATE HEALTH INSURANCE | Attending: Cardiovascular Disease

## 2019-06-19 DIAGNOSIS — R0609 Other forms of dyspnea: Secondary | ICD-10-CM

## 2019-06-19 DIAGNOSIS — R0600 Dyspnea, unspecified: Secondary | ICD-10-CM | POA: Diagnosis not present

## 2019-07-10 ENCOUNTER — Telehealth (HOSPITAL_COMMUNITY): Payer: Self-pay | Admitting: *Deleted

## 2019-07-10 NOTE — Telephone Encounter (Signed)
Attempted to call patient regarding upcoming cardiac CT appointment. Left message on voicemail with name and callback number  [place] RN Navigator [facility] [phone] Office [phone] Cell

## 2019-07-11 ENCOUNTER — Ambulatory Visit (HOSPITAL_COMMUNITY): Payer: PRIVATE HEALTH INSURANCE

## 2019-07-14 ENCOUNTER — Ambulatory Visit (HOSPITAL_COMMUNITY)
Admission: RE | Admit: 2019-07-14 | Discharge: 2019-07-14 | Disposition: A | Discharge status: Home / Self Care | Payer: PRIVATE HEALTH INSURANCE | Source: Ambulatory Visit | Attending: Internal Medicine | Admitting: Internal Medicine

## 2019-07-14 ENCOUNTER — Encounter (HOSPITAL_COMMUNITY): Payer: Self-pay

## 2019-07-14 ENCOUNTER — Other Ambulatory Visit: Payer: Self-pay

## 2019-07-14 DIAGNOSIS — R079 Chest pain, unspecified: Secondary | ICD-10-CM | POA: Insufficient documentation

## 2019-07-14 MED ORDER — NITROGLYCERIN 0.4 MG SL SUBL: 0.8000 mg | SUBLINGUAL_TABLET | Freq: Once | SUBLINGUAL | Status: AC

## 2019-07-14 MED ORDER — NITROGLYCERIN 0.4 MG SL SUBL
SUBLINGUAL_TABLET | SUBLINGUAL | Status: AC
  Administered 2019-07-14: 0.8 mg via SUBLINGUAL
  Filled 2019-07-14: qty 2

## 2019-07-14 MED ORDER — IOHEXOL 350 MG/ML SOLN
80.0000 mL | Freq: Once | INTRAVENOUS | Status: AC
  Administered 2019-07-14: 80 mL via INTRAVENOUS

## 2019-07-31 ENCOUNTER — Encounter: Payer: Self-pay | Admitting: Nurse Practitioner

## 2019-07-31 ENCOUNTER — Ambulatory Visit (INDEPENDENT_AMBULATORY_CARE_PROVIDER_SITE_OTHER): Payer: PRIVATE HEALTH INSURANCE | Admitting: Nurse Practitioner

## 2019-07-31 ENCOUNTER — Other Ambulatory Visit: Payer: Self-pay

## 2019-07-31 VITALS — BP 122/82 | HR 73 | Ht 63.0 in | Wt 178.4 lb

## 2019-07-31 DIAGNOSIS — R0600 Dyspnea, unspecified: Secondary | ICD-10-CM

## 2019-07-31 DIAGNOSIS — R072 Precordial pain: Secondary | ICD-10-CM

## 2019-07-31 DIAGNOSIS — E782 Mixed hyperlipidemia: Secondary | ICD-10-CM

## 2019-07-31 DIAGNOSIS — R002 Palpitations: Secondary | ICD-10-CM | POA: Diagnosis not present

## 2019-07-31 DIAGNOSIS — R0609 Other forms of dyspnea: Secondary | ICD-10-CM

## 2019-07-31 NOTE — Progress Notes (Signed)
Office Visit    Patient Name: Jasmine Nolan Date of Encounter: 07/31/2019  Primary Care Provider:  Hillery Aldo, PA-C Primary Cardiologist:  Yvonne Kendall, MD  Chief Complaint    49 year old female with a history of mitral valve prolapse, hypertension, hyperlipidemia, irritable bowel syndrome, and obesity, who presents for follow-up related to chest pain and palpitations.  Past Medical History    Past Medical History:  Diagnosis Date  . Anxiety   . Arthritis   . Chest pain    a. 06/2019 Cor CTA: Ca2+ = 0. Nl cors.  . Depression   . Gout   . Hepatic steatosis    a. 06/2019 incidentally noted on Coronary CTA.  Marland Kitchen Hyperlipidemia   . Hypertension   . IBS (irritable bowel syndrome)   . Inguinal mass   . Internal hemorrhoids   . Lateral epicondylitis of elbow   . MVP (mitral valve prolapse)    a. 06/2019 Echo: EF 55%, gr2DD, mod dil LA, triv MR.  . Obesity   . Palpitations    a. 05/2019 Zio: RSR, 75 (38-146), occas PVCs/rare PACs. NSVT x 2 - longest 8 beats, max 279bpm. SVT x 1 - 6 beats @ 144bpm. Triggered events corresponded w/ RSR & PVCs.  . Pneumonia   . Sinusitis    Past Surgical History:  Procedure Laterality Date  . CERVICAL FUSION  2011   Cram, 2 level  . CESAREAN SECTION  2001  . ENDOMETRIAL ABLATION    . LUMBAR DISC SURGERY  June 2011  . SPINE SURGERY    . TUBAL LIGATION      Allergies  Allergies  Allergen Reactions  . Bactrim Nausea And Vomiting  . Dicyclomine Hcl Rash  . Tetanus Toxoids Other (See Comments)    Reaction:  Fever     History of Present Illness    49 year old female with the above past medical history including mitral valve prolapse, hypertension, hyperlipidemia, irritable bowel syndrome, and obesity.  She was last seen in cardiology clinic in May 2021 with reports of palpitations and intermittent chest pain.  This was followed by event monitoring which showed 2 brief episodes of nonsustained VT (longest 8 beats) and one episode  of SVT at 144 bpm.  Triggered events corresponded with sinus rhythm and PVCs.  Echocardiogram showed an EF of 55% with grade 2 diastolic dysfunction and trivial mitral regurgitation.  Coronary CT angiography was performed in the setting of chest pain and showed a calcium score of 0 with no significant coronary disease.  Hepatic steatosis was incidentally noted.  Since her last visit, she has continued to note dyspnea on exertion and intermittent chest discomfort similar to what was previously described.  We discussed her test results in detail today and she expresses reassurance and lack of significant findings.  She is looking forward to exercising.  She denies PND, orthopnea, dizziness, syncope, edema, or early satiety.  Home Medications    Prior to Admission medications   Medication Sig Start Date End Date Taking? Authorizing Provider  baclofen (LIORESAL) 10 MG tablet Take 10 mg by mouth at bedtime.    [provider]  Diclofenac Sodium (PENNSAID) 2 % SOLN Place 1 application onto the skin 2 (two) times daily. 01/15/17   Myra Rude, MD  gabapentin (NEURONTIN) 100 MG capsule  04/11/18   [provider]  gabapentin (NEURONTIN) 100 MG capsule TK 1 C PO QD HS 09/11/18   [provider]  HYDROmorphone (DILAUDID) 2 MG  tablet 4 mg.  08/29/18   [provider]  losartan (COZAAR) 50 MG tablet TAKE 1 TABLET(50 MG) BY MOUTH DAILY 11/05/18   Guse, Janna Arch, FNP  metoprolol tartrate (LOPRESSOR) 25 MG tablet Take 1 tablet (25 mg) 2 hours prior to your Cardiac CTA 05/30/19   End, Cristal Deer, MD  omeprazole (PRILOSEC) 20 MG capsule Take 20 mg by mouth at bedtime. Reported on 05/01/2015    [provider]  sertraline (ZOLOFT) 100 MG tablet Take 200 mg by mouth at bedtime.     [provider]    Review of Systems    She continues to note dyspnea at heavier levels of exertion.  Intermittent rest and exertional chest pain which is typically brief.   Occasional palpitations.  She denies PND, orthopnea, dizziness, syncope, edema, or early satiety.  All other systems reviewed and are otherwise negative except as noted above.  Physical Exam    VS:  BP 122/82 (BP Location: Left Arm, Patient Position: Sitting, Cuff Size: Normal)   Pulse 73   Ht 5\' 3"  (1.6 m)   Wt 178 lb 6 oz (80.9 kg)   BMI 31.60 kg/m  , BMI Body mass index is 31.6 kg/m. GEN: Well nourished, well developed, in no acute distress. HEENT: normal. Neck: Supple, no JVD, carotid bruits, or masses. Cardiac: RRR, no murmurs, rubs, or gallops. No clubbing, cyanosis, edema.  Radials/PT 2+ and equal bilaterally.  Respiratory:  Respirations regular and unlabored, clear to auscultation bilaterally. GI: Soft, nontender, nondistended, BS + x 4. MS: no deformity or atrophy. Skin: warm and dry, no rash. Neuro:  Strength and sensation are intact. Psych: Normal affect.  Accessory Clinical Findings    ECG personally reviewed by me today -regular sinus rhythm, 73- no acute changes.  Lab Results  Component Value Date   WBC 7.1 10/18/2017   HGB 14.6 10/18/2017   HCT 42.6 10/18/2017   MCV 82.4 10/18/2017   PLT 193.0 10/18/2017   Lab Results  Component Value Date   CREATININE 1.11 11/30/2017   BUN 19 11/30/2017   NA 141 11/30/2017   K 4.8 11/30/2017   CL 106 11/30/2017   CO2 29 11/30/2017   Lab Results  Component Value Date   ALT 21 11/30/2017   AST 19 11/30/2017   ALKPHOS 68 11/30/2017   BILITOT 0.8 11/30/2017   Lab Results  Component Value Date   CHOL 231 (H) 10/18/2017   HDL 36.30 (L) 10/18/2017   LDLDIRECT 159.0 10/18/2017   TRIG 263.0 (H) 10/18/2017   CHOLHDL 6 10/18/2017     Assessment & Plan    1.  Chest pain: Patient with a history of intermittent rest and exertional chest discomfort which is typically brief.  She recently underwent evaluation with coronary CT angiography which showed a calcium score of 0 no significant coronary disease.  Reassurance  offered.  She is looking forward to beginning a regular exercise regimen.  2.  Dyspnea on exertion/diastolic dysfunction: EF 55% by recent echo with grade 2 diastolic dysfunction.  As above, coronary CTA showed normal coronaries.  Heart rate and blood pressure well controlled.  Regular exercise encouraged.  3.  Essential hypertension: Controlled on losartan therapy.  4.  Hyperlipidemia: Followed by primary care.  LDL of 168.  10-year risk of cardiovascular event is 8.2%.  With a coronary calcium score of 0, recommend lifestyle modification with consideration of statin in the future if this fails to significantly alter lipids.  5.  Palpitations: Occasional PVCs,  rare PACs, 2 brief runs of nonsustained VT, and one run of SVT noted on recent monitoring.  Normal LV function and normal coronary arteries on recent testing.  We discussed event monitoring findings and symptoms today.  In light of limited symptoms and history of bradycardia with propranolol therapy in the past, I will not add an AV nodal blocking agent at this time.  6.  Disposition: Patient first follow-up as needed.  Nicolasa Ducking, NP 07/31/2019, 8:32 AM

## 2019-07-31 NOTE — Patient Instructions (Signed)
Medication Instructions:  °Your physician recommends that you continue on your current medications as directed. Please refer to the Current Medication list given to you today. ° °*If you need a refill on your cardiac medications before your next appointment, please call your pharmacy* ° ° °Lab Work: °None ordered  °If you have labs (blood work) drawn today and your tests are completely normal, you will receive your results only by: °• MyChart Message (if you have MyChart) OR °• A paper copy in the mail °If you have any lab test that is abnormal or we need to change your treatment, we will call you to review the results. ° ° °Testing/Procedures: °None ordered  ° ° °Follow-Up: °At [facility], you and your health needs are our priority.  As part of our continuing mission to provide you with exceptional heart care, we have created designated Provider Care Teams.  These Care Teams include your primary Cardiologist (physician) and Advanced Practice Providers (APPs -  Physician Assistants and Nurse Practitioners) who all work together to provide you with the care you need, when you need it. ° °We recommend signing up for the patient portal called "MyChart".  Sign up information is provided on this After Visit Summary.  MyChart is used to connect with patients for Virtual Visits (Telemedicine).  Patients are able to view lab/test results, encounter notes, upcoming appointments, etc.  Non-urgent messages can be sent to your provider as well.   °To learn more about what you can do with MyChart, go to [email or web address].   ° °Your next appointment:   °As needed °

## 2020-01-03 ENCOUNTER — Ambulatory Visit: Payer: PRIVATE HEALTH INSURANCE | Attending: Internal Medicine

## 2020-01-03 DIAGNOSIS — Z23 Encounter for immunization: Secondary | ICD-10-CM

## 2020-01-03 NOTE — Progress Notes (Signed)
   Covid-[facility] Vaccination [facility]  Name:  KYNNADI DICENSO    MRN: [id number] DOB: [birth year]  01/03/2020  Ms. Preston was observed post Covid-19 immunization for 15 minutes without incident. She was provided with Vaccine Information Sheet and instruction to access the V-Safe system.   Ms. Venturini was instructed to call 911 with any severe reactions post vaccine: Marland Kitchen Difficulty breathing  . Swelling of face and throat  . A fast heartbeat  . A bad rash all over body  . Dizziness and weakness   Immunizations Administered    Name Date Dose VIS Date Route   Pfizer COVID-19 Vaccine 01/03/2020  9:42 AM 0.3 mL 11/05/2019 Intramuscular   Manufacturer: [employer], [employer]   Lot: [id number]   NDC: [address]-5

## 2020-02-23 ENCOUNTER — Other Ambulatory Visit: Payer: Self-pay | Admitting: Family Medicine

## 2020-02-23 DIAGNOSIS — Z1231 Encounter for screening mammogram for malignant neoplasm of breast: Secondary | ICD-10-CM

## 2020-03-24 ENCOUNTER — Other Ambulatory Visit: Payer: Self-pay

## 2020-03-24 ENCOUNTER — Ambulatory Visit
Admission: RE | Admit: 2020-03-24 | Discharge: 2020-03-24 | Disposition: A | Discharge status: Home / Self Care | Payer: PRIVATE HEALTH INSURANCE | Source: Ambulatory Visit

## 2020-03-24 DIAGNOSIS — Z1231 Encounter for screening mammogram for malignant neoplasm of breast: Secondary | ICD-10-CM

## 2020-11-27 IMAGING — CT CT HEART MORP W/ CTA COR W/ SCORE W/ CA W/CM &/OR W/O CM
4 of 7 series · 8 of 20 positions shown, 9 images · IV contrast (APPLIED)
Comparison: None.
COMPARISON: None.

Addendum:
EXAM:
OVER-READ INTERPRETATION  CT CHEST

The following report is an over-read performed by radiologist Dr.
Donnatienne Nelissa [REDACTED] on 07/14/2019. This
over-read does not include interpretation of cardiac or coronary
anatomy or pathology. The coronary calcium score/coronary CTA
interpretation by the cardiologist is attached.
TECHNIQUE: The patient was scanned on a [employer] Force scanner.

[Series 6: best diast 75 % · axial · 0.40mm/px · z∈[+1172,+1216]mm · 2 of 328 slices shown, 3 images]
[im 110/328  vessel]
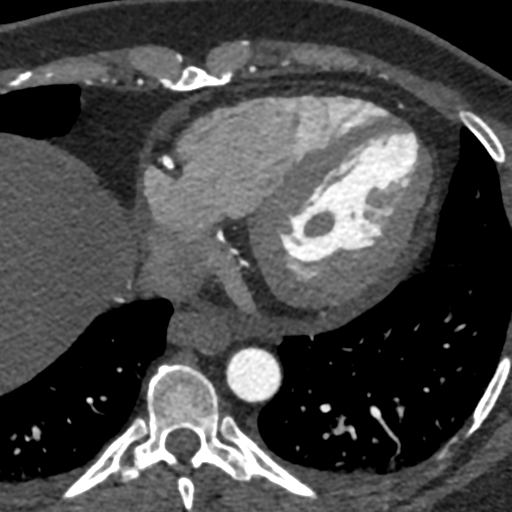
[im 110/328  lung]
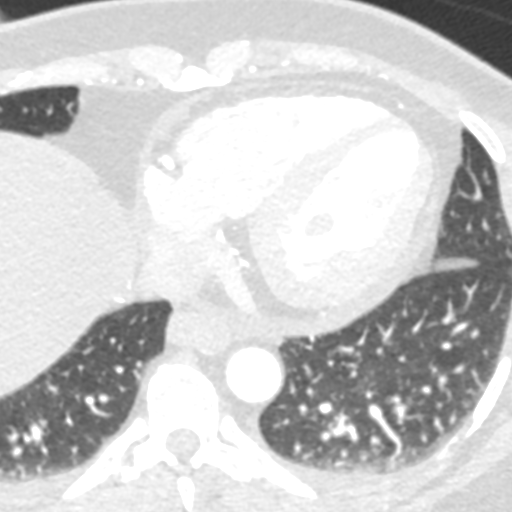
[im 219/328  vessel]
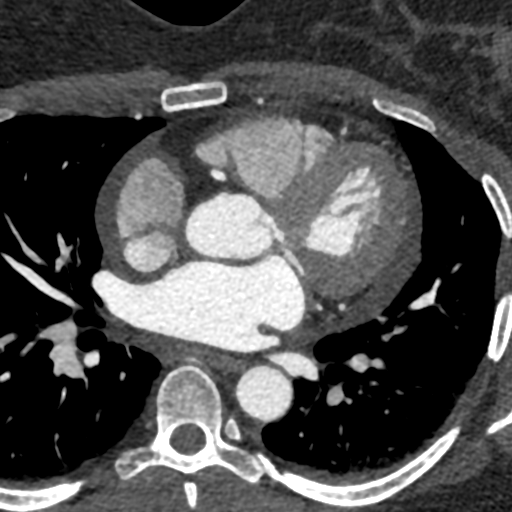

[Series 7: best syst 39 % · axial · 0.40mm/px · z∈[+1172,+1216]mm · 2 of 328 slices shown]
[im 110/328  vessel]
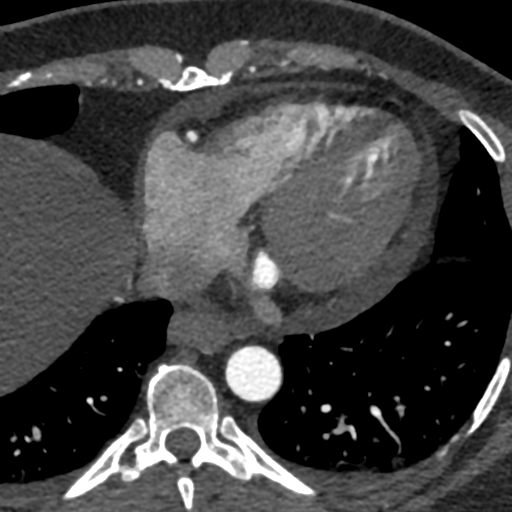
[im 219/328  vessel]
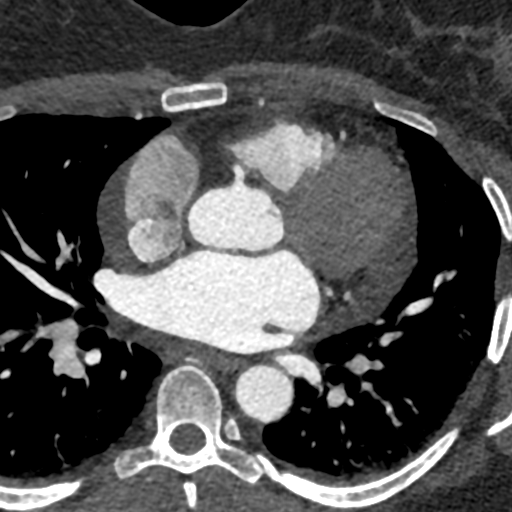

[Series 8: ts diast sharp 75 % · axial · 0.40mm/px · z∈[+1172,+1216]mm · 2 of 328 slices shown]
[im 110/328  lung]
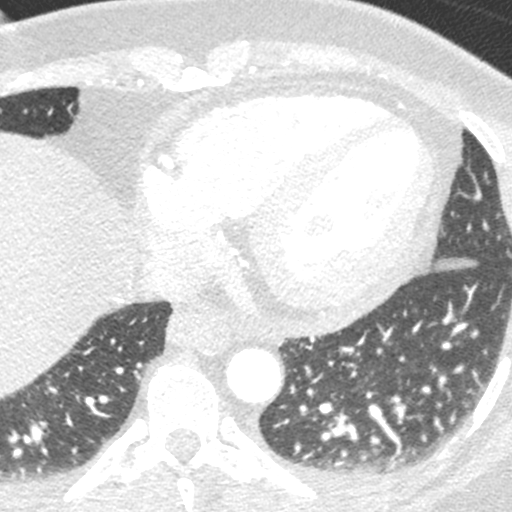
[im 219/328  lung]
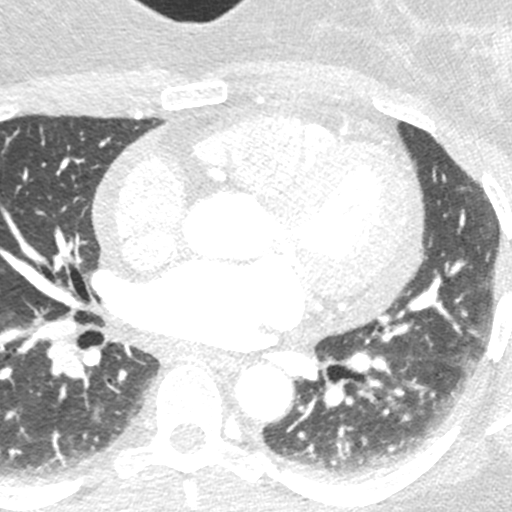

[Series 9: ts syst sharp 39 % · axial · 0.40mm/px · z∈[+1172,+1216]mm · 2 of 328 slices shown]
[im 110/328  lung]
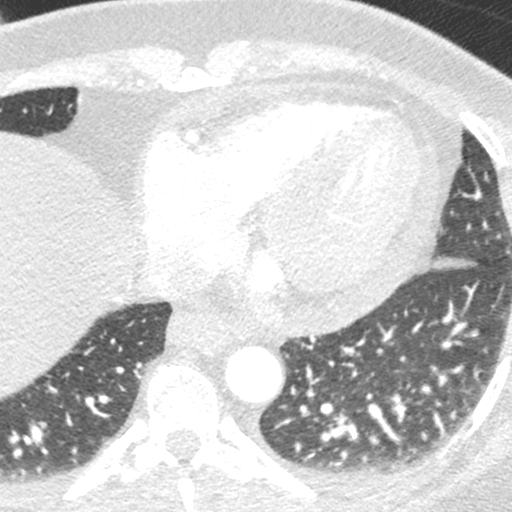
[im 219/328  lung]
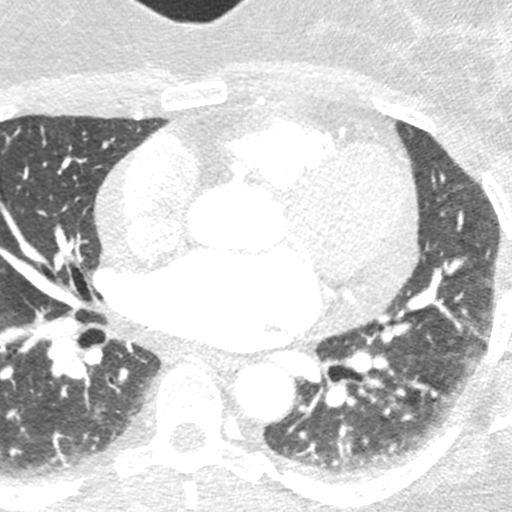

[8 of 20 positions shown; findings below may reference images not displayed]

FINDINGS: Within the visualized portions of the thorax there are no suspicious
appearing pulmonary nodules or masses, there is no acute
consolidative airspace disease, no pleural effusions, no
pneumothorax and no lymphadenopathy. Visualized portions of the
upper abdomen demonstrates diffuse low attenuation throughout the
visualized hepatic parenchyma. There are no aggressive appearing
lytic or blastic lesions noted in the visualized portions of the
skeleton.
IMPRESSION: 1. Hepatic steatosis.

EXAM:
Cardiac/Coronary  CT
FINDINGS: A 120 kV prospective scan was triggered in the descending thoracic
aorta at 111 HU's. Axial non-contrast 3 mm slices were carried out
through the heart. The data set was analyzed on a dedicated work
station and scored using the Agatson method. Gantry rotation speed
was 250 msecs and collimation was .6 mm. No beta blockade and 0.8 mg
of sl NTG was given. The 3D data set was reconstructed in 5%
intervals of the 67-82 % of the R-R cycle. Diastolic phases were
analyzed on a dedicated work station using MPR, MIP and VRT modes.
The patient received 80 cc of contrast.

Aorta:  Normal size.  No calcifications.  No dissection.

Aortic Valve:  Trileaflet.  No calcifications.

Coronary Arteries:  Normal coronary origin.  Right dominance.

RCA is a large dominant artery that gives rise to PDA and PLVB.
There is no plaque.

Left main is a large artery that gives rise to LAD and LCX arteries.
There is no plaque.

LAD is a large vessel that gives rise to a large trifurcating
diagonal. There is no plaque.

LCX is a non-dominant artery that gives rise to one large OM1
branch. There is no plaque.

Other findings:

Normal pulmonary vein drainage into the left atrium.

Normal let atrial appendage without a thrombus.

Normal size of the pulmonary artery.
IMPRESSION: 1. Coronary calcium score of 0. This was 0 percentile for age and
sex matched control.

2. Normal coronary origin with right dominance.

3. No evidence of CAD.  CAD-RADS=0.

4. Consider non-atherosclerotic causes of chest pain.

Emanuel Dawson

*** Mena of Addendum ***
EXAM:
OVER-READ INTERPRETATION  CT CHEST

The following report is an over-read performed by radiologist Dr.
Donnatienne Nelissa [REDACTED] on 07/14/2019. This
over-read does not include interpretation of cardiac or coronary
anatomy or pathology. The coronary calcium score/coronary CTA
interpretation by the cardiologist is attached.
FINDINGS: Within the visualized portions of the thorax there are no suspicious
appearing pulmonary nodules or masses, there is no acute
consolidative airspace disease, no pleural effusions, no
pneumothorax and no lymphadenopathy. Visualized portions of the
upper abdomen demonstrates diffuse low attenuation throughout the
visualized hepatic parenchyma. There are no aggressive appearing
lytic or blastic lesions noted in the visualized portions of the
skeleton.
IMPRESSION: 1. Hepatic steatosis.

## 2021-03-04 ENCOUNTER — Other Ambulatory Visit: Payer: Self-pay | Admitting: Family Medicine

## 2021-03-04 DIAGNOSIS — Z1231 Encounter for screening mammogram for malignant neoplasm of breast: Secondary | ICD-10-CM

## 2021-03-25 ENCOUNTER — Ambulatory Visit
Admission: RE | Admit: 2021-03-25 | Discharge: 2021-03-25 | Disposition: A | Discharge status: Home / Self Care | Payer: No Typology Code available for payment source | Source: Ambulatory Visit | Attending: Family Medicine | Admitting: Family Medicine

## 2021-03-25 DIAGNOSIS — Z1231 Encounter for screening mammogram for malignant neoplasm of breast: Secondary | ICD-10-CM

## 2022-03-03 ENCOUNTER — Other Ambulatory Visit: Payer: Self-pay | Admitting: Family Medicine

## 2022-03-03 DIAGNOSIS — Z1231 Encounter for screening mammogram for malignant neoplasm of breast: Secondary | ICD-10-CM

## 2022-04-14 ENCOUNTER — Ambulatory Visit
Admission: RE | Admit: 2022-04-14 | Discharge: 2022-04-14 | Disposition: A | Discharge status: Home / Self Care | Payer: No Typology Code available for payment source | Source: Ambulatory Visit | Attending: Family Medicine | Admitting: Family Medicine

## 2022-04-14 DIAGNOSIS — Z1231 Encounter for screening mammogram for malignant neoplasm of breast: Secondary | ICD-10-CM

## 2023-04-09 ENCOUNTER — Other Ambulatory Visit: Payer: Self-pay | Admitting: Family Medicine

## 2023-04-09 DIAGNOSIS — Z1231 Encounter for screening mammogram for malignant neoplasm of breast: Secondary | ICD-10-CM

## 2023-04-17 ENCOUNTER — Ambulatory Visit
Admission: RE | Admit: 2023-04-17 | Discharge: 2023-04-17 | Disposition: A | Discharge status: Home / Self Care | Source: Ambulatory Visit | Attending: Family Medicine | Admitting: Family Medicine

## 2023-04-17 DIAGNOSIS — Z1231 Encounter for screening mammogram for malignant neoplasm of breast: Secondary | ICD-10-CM
